# Patient Record
Sex: Female | Born: 1985 | Race: Black or African American | Hispanic: No | Marital: Single | Smoking: Current every day smoker
Health system: Southern US, Community
[De-identification: ages and names within clinical notes are randomized; demographics above are authoritative.]

## PROBLEM LIST (undated history)

## (undated) ENCOUNTER — Inpatient Hospital Stay (HOSPITAL_COMMUNITY): Payer: Self-pay

## (undated) DIAGNOSIS — J45909 Unspecified asthma, uncomplicated: Secondary | ICD-10-CM

## (undated) DIAGNOSIS — F32A Depression, unspecified: Secondary | ICD-10-CM

## (undated) HISTORY — PX: TUBAL LIGATION: SHX77

---

## 2013-05-27 DIAGNOSIS — IMO0001 Reserved for inherently not codable concepts without codable children: Secondary | ICD-10-CM | POA: Insufficient documentation

## 2013-05-27 DIAGNOSIS — A568 Sexually transmitted chlamydial infection of other sites: Secondary | ICD-10-CM | POA: Insufficient documentation

## 2013-05-27 DIAGNOSIS — A5403 Gonococcal cervicitis, unspecified: Secondary | ICD-10-CM | POA: Insufficient documentation

## 2013-05-27 DIAGNOSIS — R519 Headache, unspecified: Secondary | ICD-10-CM | POA: Insufficient documentation

## 2013-05-27 DIAGNOSIS — N841 Polyp of cervix uteri: Secondary | ICD-10-CM | POA: Insufficient documentation

## 2013-05-27 DIAGNOSIS — N898 Other specified noninflammatory disorders of vagina: Secondary | ICD-10-CM | POA: Insufficient documentation

## 2013-05-27 DIAGNOSIS — F32A Depression, unspecified: Secondary | ICD-10-CM | POA: Insufficient documentation

## 2013-05-27 DIAGNOSIS — Z23 Encounter for immunization: Secondary | ICD-10-CM | POA: Insufficient documentation

## 2013-05-27 DIAGNOSIS — N920 Excessive and frequent menstruation with regular cycle: Secondary | ICD-10-CM | POA: Insufficient documentation

## 2013-06-21 DIAGNOSIS — K8020 Calculus of gallbladder without cholecystitis without obstruction: Secondary | ICD-10-CM | POA: Insufficient documentation

## 2013-07-04 DIAGNOSIS — Z9189 Other specified personal risk factors, not elsewhere classified: Secondary | ICD-10-CM | POA: Insufficient documentation

## 2013-07-04 DIAGNOSIS — Z3040 Encounter for surveillance of contraceptives, unspecified: Secondary | ICD-10-CM | POA: Insufficient documentation

## 2013-07-04 DIAGNOSIS — N760 Acute vaginitis: Secondary | ICD-10-CM | POA: Insufficient documentation

## 2014-01-31 DIAGNOSIS — N870 Mild cervical dysplasia: Secondary | ICD-10-CM | POA: Insufficient documentation

## 2014-01-31 DIAGNOSIS — Z01419 Encounter for gynecological examination (general) (routine) without abnormal findings: Secondary | ICD-10-CM | POA: Insufficient documentation

## 2014-03-05 DIAGNOSIS — R87811 Vaginal high risk human papillomavirus (HPV) DNA test positive: Secondary | ICD-10-CM | POA: Insufficient documentation

## 2014-03-15 DIAGNOSIS — A630 Anogenital (venereal) warts: Secondary | ICD-10-CM | POA: Insufficient documentation

## 2014-03-20 DIAGNOSIS — R109 Unspecified abdominal pain: Secondary | ICD-10-CM | POA: Insufficient documentation

## 2014-05-12 ENCOUNTER — Encounter (HOSPITAL_BASED_OUTPATIENT_CLINIC_OR_DEPARTMENT_OTHER): Payer: Self-pay | Admitting: Emergency Medicine

## 2014-05-12 ENCOUNTER — Emergency Department (HOSPITAL_BASED_OUTPATIENT_CLINIC_OR_DEPARTMENT_OTHER)
Admission: EM | Admit: 2014-05-12 | Discharge: 2014-05-12 | Disposition: A | Discharge status: Home / Self Care | Payer: Medicare Other | Attending: Emergency Medicine | Admitting: Emergency Medicine

## 2014-05-12 DIAGNOSIS — R1031 Right lower quadrant pain: Secondary | ICD-10-CM | POA: Insufficient documentation

## 2014-05-12 DIAGNOSIS — N390 Urinary tract infection, site not specified: Secondary | ICD-10-CM | POA: Insufficient documentation

## 2014-05-12 DIAGNOSIS — R1032 Left lower quadrant pain: Secondary | ICD-10-CM | POA: Diagnosis present

## 2014-05-12 DIAGNOSIS — Z3202 Encounter for pregnancy test, result negative: Secondary | ICD-10-CM | POA: Insufficient documentation

## 2014-05-12 DIAGNOSIS — N72 Inflammatory disease of cervix uteri: Secondary | ICD-10-CM

## 2014-05-12 DIAGNOSIS — F17200 Nicotine dependence, unspecified, uncomplicated: Secondary | ICD-10-CM | POA: Insufficient documentation

## 2014-05-12 LAB — URINALYSIS, ROUTINE W REFLEX MICROSCOPIC
Glucose, UA: NEGATIVE mg/dL
Ketones, ur: 15 mg/dL — AB
NITRITE: NEGATIVE
PROTEIN: 30 mg/dL — AB
SPECIFIC GRAVITY, URINE: 1.033 — AB (ref 1.005–1.030)
UROBILINOGEN UA: 4 mg/dL — AB (ref 0.0–1.0)
pH: 6 (ref 5.0–8.0)

## 2014-05-12 LAB — WET PREP, GENITAL
Trich, Wet Prep: NONE SEEN
Yeast Wet Prep HPF POC: NONE SEEN

## 2014-05-12 LAB — URINE MICROSCOPIC-ADD ON

## 2014-05-12 LAB — PREGNANCY, URINE: PREG TEST UR: NEGATIVE

## 2014-05-12 MED ORDER — AZITHROMYCIN 250 MG PO TABS
1000.0000 mg | ORAL_TABLET | Freq: Once | ORAL | Status: AC
  Administered 2014-05-12: 1000 mg via ORAL
  Filled 2014-05-12: qty 4

## 2014-05-12 MED ORDER — CEFTRIAXONE SODIUM 250 MG IJ SOLR
250.0000 mg | Freq: Once | INTRAMUSCULAR | Status: AC
  Administered 2014-05-12: 250 mg via INTRAMUSCULAR
  Filled 2014-05-12: qty 250

## 2014-05-12 MED ORDER — SULFAMETHOXAZOLE-TRIMETHOPRIM 800-160 MG PO TABS: 1.0000 | ORAL_TABLET | Freq: Two times a day (BID) | ORAL | Status: DC

## 2014-05-12 NOTE — ED Notes (Signed)
Pelvic cart is at the bedside set up and ready for the doctor to use. 

## 2014-05-12 NOTE — ED Provider Notes (Signed)
CSN: [id number]     Arrival date & time 05/12/14  1207 History   First MD Initiated Contact with Patient 05/12/14 1236     Chief Complaint  Patient presents with  . Abdominal Pain     (Consider location/radiation/quality/duration/timing/severity/associated sxs/prior Treatment) Patient is a 28 y.o. female presenting with abdominal pain. The history is provided by the patient. No language interpreter was used.  Abdominal Pain Pain location:  LLQ, RLQ and suprapubic Pain quality: aching and cramping   Pain radiates to:  L flank and R flank Pain severity:  Moderate Associated symptoms: nausea   Associated symptoms: no chest pain, no constipation, no cough, no diarrhea, no dysuria, no fever, no shortness of breath, no vaginal bleeding, no vaginal discharge and no vomiting   Associated symptoms comment:  Lower abdominal pain for the past 2-3 days. No fever. She reports nausea without vomiting. No change in bowel habits. She denies vaginal discharge, dysuria or abnormal vaginal bleeding.    History reviewed. No pertinent past medical history. History reviewed. No pertinent past surgical history. No family history on file. History  Substance Use Topics  . Smoking status: Current Every Day Smoker  . Smokeless tobacco: Not on file  . Alcohol Use: Yes     Comment: a few times a week   OB History   Grav Para Term Preterm Abortions TAB SAB Ect Mult Living                 Review of Systems  Constitutional: Negative for fever.  Respiratory: Negative for cough and shortness of breath.   Cardiovascular: Negative for chest pain.  Gastrointestinal: Positive for nausea and abdominal pain. Negative for vomiting, diarrhea and constipation.  Genitourinary: Positive for flank pain. Negative for dysuria, frequency, vaginal bleeding and vaginal discharge.      Allergies  Review of patient's allergies indicates no known allergies.  Home Medications   Prior to Admission medications   Not on  File   BP 122/86  Pulse 110  Temp(Src) 99 F (37.2 C) (Oral)  Resp 20  Ht 5\' 2"  (1.575 m)  Wt 214 lb (97.07 kg)  BMI 39.13 kg/m2  SpO2 100%  LMP 05/02/2014 Physical Exam  Constitutional: She is oriented to person, place, and time. She appears well-developed and well-nourished.  HENT:  Head: Normocephalic.  Neck: Normal range of motion. Neck supple.  Cardiovascular: Normal rate and regular rhythm.   Pulmonary/Chest: Effort normal and breath sounds normal.  Abdominal: Soft. Bowel sounds are normal. There is no rebound and no guarding.  Lower abdominal tenderness to soft abdomen. BS active. No palpable mass.   Genitourinary: Vagina normal.  Cervical discharge present that is thin, green in appearance. There is bimanual tenderness throughout pelvis without mass.   Musculoskeletal: Normal range of motion.  Neurological: She is alert and oriented to person, place, and time.  Skin: Skin is warm and dry. No rash noted.  Psychiatric: She has a normal mood and affect.    ED Course  Procedures (including critical care time) Labs Review Labs Reviewed  WET PREP, GENITAL - Abnormal; Notable for the following:    Clue Cells Wet Prep HPF POC MODERATE (*)    WBC, Wet Prep HPF POC TOO NUMEROUS TO COUNT (*)    All other components within normal limits  URINALYSIS, ROUTINE W REFLEX MICROSCOPIC - Abnormal; Notable for the following:    Color, Urine AMBER (*)    APPearance CLOUDY (*)    Specific Gravity, Urine 1.033 (*)  Hgb urine dipstick MODERATE (*)    Bilirubin Urine SMALL (*)    Ketones, ur 15 (*)    Protein, ur 30 (*)    Urobilinogen, UA 4.0 (*)    Leukocytes, UA MODERATE (*)    All other components within normal limits  URINE MICROSCOPIC-ADD ON - Abnormal; Notable for the following:    Squamous Epithelial / LPF FEW (*)    Bacteria, UA MANY (*)    All other components within normal limits  GC/CHLAMYDIA PROBE AMP  PREGNANCY, URINE    Imaging Review No results found.   EKG  Interpretation None      MDM   Final diagnoses:  None    1. UTI 2. Cervicitis  No fever reported more than low grade. Mild pelvic tenderness. Doubt fulminant PID or torsion. She has evidence of UTI on urinalysis with bilateral flank pain. There is also cervical discharge with pelvic tenderness that reproduces her abdominal pain of complaint. Cultures pending. Azithromycin/Rocephin given in ED. Will give Septra for 5 days for UTI. Recommend Doxycycline x 7 days if cultures positive.     Dewaine Oats, PA-C 05/12/14 1408

## 2014-05-12 NOTE — Discharge Instructions (Signed)
Cervicitis Cervicitis is a soreness and swelling (inflammation) of the cervix. Your cervix is located at the bottom of your uterus. It opens up to the vagina. CAUSES   Sexually transmitted infections (STIs).   Allergic reaction.   Medicines or birth control devices that are put in the vagina.   Injury to the cervix.   Bacterial infections.  RISK FACTORS You are at greater risk if you:  Have unprotected sexual intercourse.  Have sexual intercourse with many partners.  Began sexual intercourse at an early age.  Have a history of STIs. SYMPTOMS  There may be no symptoms. If symptoms occur, they may include:   Grey, white, yellow, or bad-smelling vaginal discharge.   Pain or itching of the area outside the vagina.   Painful sexual intercourse.   Lower abdominal or lower back pain, especially during intercourse.   Frequent urination.   Abnormal vaginal bleeding between periods, after sexual intercourse, or after menopause.   Pressure or a heavy feeling in the pelvis.  DIAGNOSIS  Diagnosis is made after a pelvic exam. Other tests may include:   Examination of any discharge under a microscope (wet prep).   A Pap test.  TREATMENT  Treatment will depend on the cause of cervicitis. If it is caused by an STI, both you and your partner will need to be treated. Antibiotic medicines will be given.  HOME CARE INSTRUCTIONS   Do not have sexual intercourse until your health care provider says it is okay.   Do not have sexual intercourse until your partner has been treated, if your cervicitis is caused by an STI.   Take your antibiotics as directed. Finish them even if you start to feel better.  SEEK MEDICAL CARE IF:  Your symptoms come back.   You have a fever.  MAKE SURE YOU:   Understand these instructions.  Will watch your condition.  Will get help right away if you are not doing well or get worse. Document Released: 12/07/2005 Document Revised:  08/09/2013 Document Reviewed: 05/31/2013 [facility] Patient Information 2014 [place]. Urinary Tract Infection Urinary tract infections (UTIs) can develop anywhere along your urinary tract. Your urinary tract is your body's drainage system for removing wastes and extra water. Your urinary tract includes two kidneys, two ureters, a bladder, and a urethra. Your kidneys are a pair of bean-shaped organs. Each kidney is about the size of your fist. They are located below your ribs, one on each side of your spine. CAUSES Infections are caused by microbes, which are microscopic organisms, including fungi, viruses, and bacteria. These organisms are so small that they can only be seen through a microscope. Bacteria are the microbes that most commonly cause UTIs. SYMPTOMS  Symptoms of UTIs may vary by age and gender of the patient and by the location of the infection. Symptoms in young women typically include a frequent and intense urge to urinate and a painful, burning feeling in the bladder or urethra during urination. Older women and men are more likely to be tired, shaky, and weak and have muscle aches and abdominal pain. A fever may mean the infection is in your kidneys. Other symptoms of a kidney infection include pain in your back or sides below the ribs, nausea, and vomiting. DIAGNOSIS To diagnose a UTI, your caregiver will ask you about your symptoms. Your caregiver also will ask to provide a urine sample. The urine sample will be tested for bacteria and white blood cells. White blood cells are made by your  made by your body to help fight infection. °TREATMENT  °Typically, UTIs can be treated with medication. Because most UTIs are caused by a bacterial infection, they usually can be treated with the use of antibiotics. The choice of antibiotic and length of treatment depend on your symptoms and the type of bacteria causing your infection. °HOME CARE INSTRUCTIONS °· If you were prescribed antibiotics, take them  exactly as your caregiver instructs you. Finish the medication even if you feel better after you have only taken some of the medication. °· Drink enough water and fluids to keep your urine clear or pale yellow. °· Avoid caffeine, tea, and carbonated beverages. They tend to irritate your bladder. °· Empty your bladder often. Avoid holding urine for long periods of time. °· Empty your bladder before and after sexual intercourse. °· After a bowel movement, women should cleanse from front to back. Use each tissue only once. °SEEK MEDICAL CARE IF:  °· You have back pain. °· You develop a fever. °· Your symptoms do not begin to resolve within 3 days. °SEEK IMMEDIATE MEDICAL CARE IF:  °· You have severe back pain or lower abdominal pain. °· You develop chills. °· You have nausea or vomiting. °· You have continued burning or discomfort with urination. °MAKE SURE YOU:  °· Understand these instructions. °· Will watch your condition. °· Will get help right away if you are not doing well or get worse. °Document Released: 09/16/2005 Document Revised: 06/07/2012 Document Reviewed: 01/15/2012 °[employer]® Patient Information ©2014 [employer]. ° °

## 2014-05-12 NOTE — ED Notes (Addendum)
"  uncomfortable, don't feel right, vision blurry, in discomfort". Headaches, "can't go to the bathroom right". Took castoroil, no appetite.

## 2014-05-13 NOTE — ED Provider Notes (Signed)
Medical screening examination/treatment/procedure(s) were performed by non-physician practitioner and as supervising physician I was immediately available for consultation/collaboration.   EKG Interpretation None        Houston Siren III, MD 05/13/14 [phone]

## 2014-05-14 LAB — GC/CHLAMYDIA PROBE AMP
CT Probe RNA: POSITIVE — AB
GC Probe RNA: NEGATIVE

## 2014-05-15 ENCOUNTER — Telehealth (HOSPITAL_BASED_OUTPATIENT_CLINIC_OR_DEPARTMENT_OTHER): Payer: Self-pay | Admitting: Emergency Medicine

## 2014-05-16 ENCOUNTER — Telehealth (HOSPITAL_BASED_OUTPATIENT_CLINIC_OR_DEPARTMENT_OTHER): Payer: Self-pay

## 2014-05-16 NOTE — Telephone Encounter (Signed)
Pt returned call.    ID verified x 2.  Pt informed Chlamydia (+), tx rcvd appropriate, inform partner(s) for testing and tx and abstain from sex x 2 wks from tx date.

## 2015-04-09 ENCOUNTER — Other Ambulatory Visit: Payer: Self-pay

## 2015-04-09 ENCOUNTER — Encounter (HOSPITAL_BASED_OUTPATIENT_CLINIC_OR_DEPARTMENT_OTHER): Payer: Self-pay | Admitting: *Deleted

## 2015-04-09 ENCOUNTER — Emergency Department (HOSPITAL_BASED_OUTPATIENT_CLINIC_OR_DEPARTMENT_OTHER)
Admission: EM | Admit: 2015-04-09 | Discharge: 2015-04-09 | Disposition: A | Discharge status: Home / Self Care | Payer: Medicare Other | Attending: Emergency Medicine | Admitting: Emergency Medicine

## 2015-04-09 ENCOUNTER — Emergency Department (HOSPITAL_BASED_OUTPATIENT_CLINIC_OR_DEPARTMENT_OTHER): Payer: Medicare Other

## 2015-04-09 DIAGNOSIS — Z3202 Encounter for pregnancy test, result negative: Secondary | ICD-10-CM | POA: Diagnosis not present

## 2015-04-09 DIAGNOSIS — Z720 Tobacco use: Secondary | ICD-10-CM | POA: Insufficient documentation

## 2015-04-09 DIAGNOSIS — R079 Chest pain, unspecified: Secondary | ICD-10-CM | POA: Diagnosis present

## 2015-04-09 DIAGNOSIS — R51 Headache: Secondary | ICD-10-CM | POA: Insufficient documentation

## 2015-04-09 DIAGNOSIS — R0602 Shortness of breath: Secondary | ICD-10-CM | POA: Diagnosis not present

## 2015-04-09 DIAGNOSIS — R0789 Other chest pain: Secondary | ICD-10-CM | POA: Diagnosis not present

## 2015-04-09 DIAGNOSIS — J45901 Unspecified asthma with (acute) exacerbation: Secondary | ICD-10-CM | POA: Diagnosis not present

## 2015-04-09 HISTORY — DX: Unspecified asthma, uncomplicated: J45.909

## 2015-04-09 LAB — COMPREHENSIVE METABOLIC PANEL
ALT: 13 U/L (ref 0–35)
ANION GAP: 6 (ref 5–15)
AST: 15 U/L (ref 0–37)
Albumin: 3.6 g/dL (ref 3.5–5.2)
Alkaline Phosphatase: 67 U/L (ref 39–117)
BUN: 7 mg/dL (ref 6–23)
CO2: 25 mmol/L (ref 19–32)
Calcium: 8.5 mg/dL (ref 8.4–10.5)
Chloride: 108 mmol/L (ref 96–112)
Creatinine, Ser: 0.62 mg/dL (ref 0.50–1.10)
GFR calc Af Amer: >90 mL/min (ref >90)
GFR calc non Af Amer: >90 mL/min (ref >90)
Glucose, Bld: 94 mg/dL (ref 70–99)
POTASSIUM: 3.4 mmol/L — AB (ref 3.5–5.1)
SODIUM: 139 mmol/L (ref 135–145)
TOTAL PROTEIN: 7 g/dL (ref 6.0–8.3)
Total Bilirubin: 0.6 mg/dL (ref 0.3–1.2)

## 2015-04-09 LAB — CBC WITH DIFFERENTIAL/PLATELET
Basophils Absolute: 0 10*3/uL (ref 0.0–0.1)
Basophils Relative: 0 % (ref 0–1)
EOS PCT: 2 % (ref 0–5)
Eosinophils Absolute: 0.1 10*3/uL (ref 0.0–0.7)
HEMATOCRIT: 36.4 % (ref 36.0–46.0)
Hemoglobin: 12 g/dL (ref 12.0–15.0)
LYMPHS ABS: 2.5 10*3/uL (ref 0.7–4.0)
Lymphocytes Relative: 39 % (ref 12–46)
MCH: 25.8 pg — AB (ref 26.0–34.0)
MCHC: 33 g/dL (ref 30.0–36.0)
MCV: 78.1 fL (ref 78.0–100.0)
Monocytes Absolute: 0.4 10*3/uL (ref 0.1–1.0)
Monocytes Relative: 6 % (ref 3–12)
Neutro Abs: 3.4 10*3/uL (ref 1.7–7.7)
Neutrophils Relative %: 53 % (ref 43–77)
PLATELETS: 206 10*3/uL (ref 150–400)
RBC: 4.66 MIL/uL (ref 3.87–5.11)
RDW: 16.2 % — AB (ref 11.5–15.5)
WBC: 6.4 10*3/uL (ref 4.0–10.5)

## 2015-04-09 LAB — D-DIMER, QUANTITATIVE (NOT AT ARMC)

## 2015-04-09 LAB — PREGNANCY, URINE: PREG TEST UR: NEGATIVE

## 2015-04-09 LAB — TROPONIN I

## 2015-04-09 MED ORDER — SODIUM CHLORIDE 0.9 % IV BOLUS (SEPSIS)
1000.0000 mL | Freq: Once | INTRAVENOUS | Status: AC
  Administered 2015-04-09: 1000 mL via INTRAVENOUS

## 2015-04-09 MED ORDER — ALBUTEROL SULFATE HFA 108 (90 BASE) MCG/ACT IN AERS
2.0000 | INHALATION_SPRAY | Freq: Four times a day (QID) | RESPIRATORY_TRACT | Status: DC | PRN
  Administered 2015-04-09: 2 via RESPIRATORY_TRACT
  Filled 2015-04-09: qty 6.7

## 2015-04-09 MED ORDER — POTASSIUM CHLORIDE CRYS ER 20 MEQ PO TBCR
40.0000 meq | EXTENDED_RELEASE_TABLET | Freq: Once | ORAL | Status: AC
  Administered 2015-04-09: 40 meq via ORAL
  Filled 2015-04-09: qty 2

## 2015-04-09 NOTE — ED Notes (Addendum)
C/o mid upper chest pain and h/a Onset this am. No cough, sob, n/v or fever. Pt states she has had these sx before and been dx with stress.

## 2015-04-09 NOTE — ED Notes (Signed)
Pt placed on cardiac monitor 

## 2015-04-09 NOTE — Discharge Instructions (Signed)
Please call your doctor for a followup appointment within 24-48 hours. When you talk to your doctor please let them know that you were seen in the emergency department and have them acquire all of your records so that they can discuss the findings with you and formulate a treatment plan to fully care for your new and ongoing problems. Please follow-up with her primary care provider Please rest and stay hydrated Please avoid any physical or strenuous activity Please use albuterol inhaler as needed for shortness of breath or wheezing-please use only as needed every 6 hours Please continue to monitor symptoms closely and if symptoms are to worsen or change (fever greater than 101, chills, sweating, nausea, vomiting, chest pain, shortness of breathe, difficulty breathing, weakness, numbness, tingling, worsening or changes to pain pattern, dizziness, blurred vision, sudden loss of vision, neck pain, neck stiffness, worsening or changes to chest pain, cough, coughing up of blood, inability to keep food or fluids down, leg swelling) please report back to the Emergency Department immediately.    Chest Pain (Nonspecific) It is often hard to give a specific diagnosis for the cause of chest pain. There is always a chance that your pain could be related to something serious, such as a heart attack or a blood clot in the lungs. You need to follow up with your health care provider for further evaluation. CAUSES   Heartburn.  Pneumonia or bronchitis.  Anxiety or stress.  Inflammation around your heart (pericarditis) or lung (pleuritis or pleurisy).  A blood clot in the lung.  A collapsed lung (pneumothorax). It can develop suddenly on its own (spontaneous pneumothorax) or from trauma to the chest.  Shingles infection (herpes zoster virus). The chest wall is composed of bones, muscles, and cartilage. Any of these can be the source of the pain.  The bones can be bruised by injury.  The muscles or  cartilage can be strained by coughing or overwork.  The cartilage can be affected by inflammation and become sore (costochondritis). DIAGNOSIS  Lab tests or other studies may be needed to find the cause of your pain. Your health care provider may have you take a test called an ambulatory electrocardiogram (ECG). An ECG records your heartbeat patterns over a 24-hour period. You may also have other tests, such as:  Transthoracic echocardiogram (TTE). During echocardiography, sound waves are used to evaluate how blood flows through your heart.  Transesophageal echocardiogram (TEE).  Cardiac monitoring. This allows your health care provider to monitor your heart rate and rhythm in real time.  Holter monitor. This is a portable device that records your heartbeat and can help diagnose heart arrhythmias. It allows your health care provider to track your heart activity for several days, if needed.  Stress tests by exercise or by giving medicine that makes the heart beat faster. TREATMENT   Treatment depends on what may be causing your chest pain. Treatment may include:  Acid blockers for heartburn.  Anti-inflammatory medicine.  Pain medicine for inflammatory conditions.  Antibiotics if an infection is present.  You may be advised to change lifestyle habits. This includes stopping smoking and avoiding alcohol, caffeine, and chocolate.  You may be advised to keep your head raised (elevated) when sleeping. This reduces the chance of acid going backward from your stomach into your esophagus. Most of the time, nonspecific chest pain will improve within 2-3 days with rest and mild pain medicine.  HOME CARE INSTRUCTIONS   If antibiotics were prescribed, take them as directed. [employer]  them even if you start to feel better.  For the next few days, avoid physical activities that bring on chest pain. Continue physical activities as directed.  Do not use any tobacco products, including cigarettes,  chewing tobacco, or electronic cigarettes.  Avoid drinking alcohol.  Only take medicine as directed by your health care provider.  Follow your health care provider's suggestions for further testing if your chest pain does not go away.  Keep any follow-up appointments you made. If you do not go to an appointment, you could develop lasting (chronic) problems with pain. If there is any problem keeping an appointment, call to reschedule. SEEK MEDICAL CARE IF:   Your chest pain does not go away, even after treatment.  You have a rash with blisters on your chest.  You have a fever. SEEK IMMEDIATE MEDICAL CARE IF:   You have increased chest pain or pain that spreads to your arm, neck, jaw, back, or abdomen.  You have shortness of breath.  You have an increasing cough, or you cough up blood.  You have severe back or abdominal pain.  You feel nauseous or vomit.  You have severe weakness.  You faint.  You have chills. This is an emergency. Do not wait to see if the pain will go away. Get medical help at once. Call your local emergency services (911 in [place] Do not drive yourself to the hospital. MAKE SURE YOU:   Understand these instructions.  Will watch your condition.  Will get help right away if you are not doing well or get worse. Document Released: 09/16/2005 Document Revised: 12/12/2013 Document Reviewed: 07/12/2008 [facility] Patient Information 2015 [place], [place]. This information is not intended to replace advice given to you by your health care provider. Make sure you discuss any questions you have with your health care provider.  Chest Wall Pain Chest wall pain is pain in or around the bones and muscles of your chest. It may take up to 6 weeks to get better. It may take longer if you must stay physically active in your work and activities.  CAUSES  Chest wall pain may happen on its own. However, it may be caused by:  A viral illness like the  flu.  Injury.  Coughing.  Exercise.  Arthritis.  Fibromyalgia.  Shingles. HOME CARE INSTRUCTIONS   Avoid overtiring physical activity. Try not to strain or perform activities that cause pain. This includes any activities using your chest or your abdominal and side muscles, especially if heavy weights are used.  Put ice on the sore area.  Put ice in a plastic bag.  Place a towel between your skin and the bag.  Leave the ice on for 15-20 minutes per hour while awake for the first 2 days.  Only take over-the-counter or prescription medicines for pain, discomfort, or fever as directed by your caregiver. SEEK IMMEDIATE MEDICAL CARE IF:   Your pain increases, or you are very uncomfortable.  You have a fever.  Your chest pain becomes worse.  You have new, unexplained symptoms.  You have nausea or vomiting.  You feel sweaty or lightheaded.  You have a cough with phlegm (sputum), or you cough up blood. MAKE SURE YOU:   Understand these instructions.  Will watch your condition.  Will get help right away if you are not doing well or get worse. Document Released: 12/07/2005 Document Revised: 02/29/2012 Document Reviewed: 08/03/2011 [facility] Patient Information 2015 [place], [place]. This information is not intended to replace advice given  to you by your health care provider. Make sure you discuss any questions you have with your health care provider.   Emergency Department Resource Guide 1) Find a Doctor and Pay Out of Pocket Although you won't have to find out who is covered by your insurance plan, it is a good idea to ask around and get recommendations. You will then need to call the office and see if the doctor you have chosen will accept you as a new patient and what types of options they offer for patients who are self-pay. Some doctors offer discounts or will set up payment plans for their patients who do not have insurance, but you will need to ask so you aren't  surprised when you get to your appointment.  2) Contact Your Local Health Department Not all health departments have doctors that can see patients for sick visits, but many do, so it is worth a call to see if yours does. If you don't know where your local health department is, you can check in your phone book. The CDC also has a tool to help you locate your state's health department, and many state websites also have listings of all of their local health departments.  3) Find a [facility] If your illness is not likely to be very severe or complicated, you may want to try a walk in clinic. These are popping up all over the country in pharmacies, drugstores, and shopping centers. They're usually staffed by nurse practitioners or physician assistants that have been trained to treat common illnesses and complaints. They're usually fairly quick and inexpensive. However, if you have serious medical issues or chronic medical problems, these are probably not your best option.  No Primary Care Doctor: - Call Health Connect at  [phone] - they can help you locate a primary care doctor that  accepts your insurance, provides certain services, etc. - Physician Referral Service- [phone]  Chronic Pain Problems: Organization         Address  Phone   Notes  [facility]  [phone] Patients need to be referred by their primary care doctor.   Medication Assistance: Organization         Address  Phone   Notes  [facility] Medication [facility] [address] [place], [place] [address] [phone] --Must be a resident of [facility] -- Must have NO insurance coverage whatsoever (no Medicaid/ Medicare, etc.) -- The pt. MUST have a primary care doctor that directs their care regularly and follows them in the community   MedAssist  [phone]   [employer]  [phone]    Agencies that provide inexpensive medical care: Organization          Address  Phone   Notes  [place]  [phone]   [facility]    [phone]   [facility] [place], [place] [address] [phone]   [place] [address] [place] [phone]   Planned Parenthood    [phone]   [facility]    [phone]   [place] and [place] [address], [place] Phone:  [phone], Fax:  [phone] Hours of Operation:  9 am - 6 pm, M-F.  Also accepts Medicaid/Medicare and self-pay.  [facility] for [place] [address], [place] Phone: [phone], Fax: [phone]. Hours of Operation:  8:30 am - 5:30 pm, M-F.  Also accepts Medicaid and self-pay.  [facility] [place] [address] [place] Phone: [phone]   [place], [place], [place] [phone], Ext. [phone] Mondays & Thursdays: 7-9 AM.  First 15 patients are seen on a first come, first serve basis.    [place] Providers:  Organization         Address  Phone   Notes  [facility] [address], [place] [phone] Also accepts self-pay patients.  [facility] [address], [place]  [phone]   [place], [address], [place] [phone]   [facility] [address] [place] [phone]   Lucianne Lei [address], [place]   [phone] Only accepts [place] Access [place] patients after they have their name applied to their card.   Self-Pay (no insurance) in [facility]:  Organization         Address  Phone   Notes  Sickle Cell Patients, [facility] Internal Medicine [place] [phone]   [facility] [place] [phone]    [facility] [place]  [place], [place], [place] [phone]   [facility]/Dr. Osei-Bonsu  [address], [place] or [address], [place] [phone] Phone number for both [place] and [place] locations is the same.  [facility] [address], [place] [phone]   [facility] [address] [place] or [address] [phone]   [facility] [address] [place] [phone], phone; [phone], fax Sees patients 1st and 3rd Saturday of every month.  Must not qualify for public or private insurance (i.e. Medicaid, Medicare, [place] Health Choice, Veterans' Benefits)  Household income should be no more than 200% of the poverty level The clinic cannot treat you if you are pregnant or think you are pregnant  Sexually transmitted diseases are not treated at the clinic.    Dental Care: Organization         Address  Phone  Notes  [facility] Department of [facility] [place] [phone] Accepts children up to age 70 who are enrolled in [place] or [place]; pregnant women with a Medicaid card; and children who have applied for Medicaid or [place] Health Choice, but were declined, whose parents can pay a reduced fee at time of service.  [facility] Department of [facility]  [address], [place] [phone] Accepts children up to age 61 who are enrolled in [place] or [place]; pregnant women with a Medicaid card; and children who have applied for Medicaid or [place] Health Choice, but were declined, whose parents can pay a reduced fee at time of service.  [facility]  [place] [phone] Patients are seen by appointment only. Walk-ins are not accepted. [place] will see patients 52  years of age and older. Monday - Tuesday (8am-5pm) Most Wednesdays (8:30-5pm) $30 per visit, cash only  [place] Adult [employer] PROGRAM  [address], [employer] [phone])  [phone] Patients are seen by appointment only. Walk-ins are not accepted. [place] will see patients 63 years of age and older. One Wednesday Evening (Monthly: Volunteer Based).  $30 per visit, cash only  [place]  [phone] for adults; Children under age 52, call Graduate Pediatric Dentistry at [phone]. Children aged 28-14, please call [phone] to request a pediatric application.  Dental services are provided in all areas of dental care including fillings, crowns and bridges, complete and partial dentures, implants, gum treatment, root canals, and extractions. Preventive care is also provided. Treatment is provided to both adults and children. Patients are selected via a lottery and there is often a waiting list.   [facility] [address], [place]  [phone] [email or web address]   [facility] [address] [place], [place] [phone], Ext. [phone] Second and Fourth Thursday of each month, opens at 6:30 AM; Clinic ends at 9 AM.  Patients are seen on a first-come first-served basis, and a limited number are seen during each clinic.   [facility]  [address] Hillard Danker [place], [place] [phone]   Eligibility Requirements You must have lived in [place], [place], or [place] counties for at least the last three months.   You cannot be eligible for state or federal sponsored [employer], including [employer], [place], or [employer].   You generally cannot be eligible for healthcare insurance through your employer.    How to apply: Eligibility screenings are held every Tuesday and Wednesday afternoon from 1:00 pm until 4:00 pm. You do not need an appointment for the interview!  [facility]  [address] [place], [place]   Miguel Barrera  [place] Department  [place]  [phone]    Behavioral Health Resources in the Community: Intensive Outpatient Programs Organization         Address  Phone  Notes  [place]. [address], [place], [place] [phone]   [facility] [address], [place], [place]   ADS: Alcohol & Drug Svcs [address] [place], [place]   [place] [address],  [place], [place] or [phone]   Substance Abuse Resources Organization         Address  Phone  Notes  Alcohol [facility] Drug Services  [phone]   Center  [phone]   [facility] [place]   Chinita Pester  [phone]   Residential & Outpatient Substance Abuse Program  [phone]   Psychological Services Organization         Address  Phone  Notes  [facility]  [place]  [phone]   [place] [address] [place] or [phone]    Mobile Crisis Teams Organization         Address  Phone  Notes  [facility], Mobile Crisis Care Unit  [phone]   [facility]otherapeutic [facility]  [address] [place], [place]   [place] [address] [place] [phone]    Self-Help/Support Groups Organization         Address  Phone             Notes  [place]. of [facility] - variety of support groups  [place]  Call for more information  Narcotics Anonymous (NA), [facility] [address], [employer]   2 meetings at this location   Residential Facilities manager         Address  Phone  Notes  [facility]idential Treatment [place],    [place]   [phone]   [facility]  [address], [place] [address]34287, [place], [place]   [place], [place] [phone] Admissions: 8am-3pm M-F  Incentives Substance [place] [address]    [place], [place] [phone]   [facility] [address] [place], [place], [place]   [facility] [address],  [place], [place]   [facility] Programs - Intensive Outpatient [address] Kristeen [address], [place], [place]   [facility] ([place].,  [place], [place] [phone] or [phone]   [facility] Services (RTS) [address], [place], [place] Accepts Medicaid  [facility] [place] [address],  [place] [phone] Substance Abuse/Addiction Treatment   [facility] Organization         Address  Phone  Notes  [facility]  [phone]   Domenic Schwab, PhD [address] Arlis [place], [place]   [phone] or [phone]   Dor[place], [place] [phone]   [facility] [address], [place], [place] [phone] Insurance/Medicaid/sponsorship through [facility] and Families [address]                                    [place], [place] [phone] [place] [address], [place] [phone]    Dr. Adele Schilder  [phone]   [facility] Dept. 1) [address],  2) [place] 3)  [place] [address], [place] [phone]  [phone]   [place] [phone] or [phone] (After Hours)

## 2015-04-09 NOTE — ED Provider Notes (Signed)
CSN: [id number]     Arrival date & time 04/09/15  1028 History   First MD Initiated Contact with Patient 04/09/15 1126     Chief Complaint  Patient presents with  . Chest Pain     (Consider location/radiation/quality/duration/timing/severity/associated sxs/prior Treatment) The history is provided by the patient. No language interpreter was used.  Michelle Giles is a 29 year old female with past medical history of asthma-has not taken medication in over 5 years-presenting to the ED with chest pain that started this morning. Patient reported that she was asleep, but continued to feel the chest discomfort. Patient reported that the pain is localized to the center of her chest and right side described as a tightening sensation. Reported that the pain is worse with deep inhalation and exhalation. Patient reported that she felt some shortness of breath and some difficulty breathing. Reported the pain stayed within the chest without radiation. Stated that she took nothing at home. Patient reports that she has experienced this pain before, the only difference is that it has stayed much longer, reports that it normally goes away with time. Patient stated that she is under a lot of stress-she is raising 5 children all by herself and states that she is currently raising her sick mother. Patient reports that she smokes approximately half a pack of cigarettes per day. Stated that she does a lot of lifting of her children daily. Reported that she is currently on Depo-Provera, reported that her last dose was either in February or March 2016, reported that her next dose is -2016. Denied wheezing, cough, nasal congestion, ear/eye complaints, neck pain, neck stiffness, sore throat, difficulty swallowing, nausea, vomiting, abdominal pain, numbness, tingling, blurred vision, sudden loss of vision, fainting, hemoptysis, leg swelling, travel. PCP Dr. Joya Gaskins   Past Medical History  Diagnosis Date  . Asthma    History  reviewed. No pertinent past surgical history. No family history on file. History  Substance Use Topics  . Smoking status: Current Every Day Smoker -- 0.50 packs/day    Types: Cigarettes  . Smokeless tobacco: Not on file  . Alcohol Use: Yes     Comment: a few times a week   OB History    No data available     Review of Systems  Constitutional: Negative for fever and chills.  Eyes: Negative for visual disturbance.  Respiratory: Positive for chest tightness and shortness of breath. Negative for cough.   Cardiovascular: Positive for chest pain. Negative for leg swelling.  Gastrointestinal: Negative for nausea, vomiting and abdominal pain.  Musculoskeletal: Negative for neck pain and neck stiffness.  Neurological: Positive for headaches. Negative for dizziness, weakness and numbness.      Allergies  Review of patient's allergies indicates no known allergies.  Home Medications   Prior to Admission medications   Not on File   BP 101/63 mmHg  Pulse 72  Temp(Src) 97.9 F (36.6 C) (Oral)  Resp 20  Ht 5\' 2"  (1.575 m)  Wt 210 lb (95.255 kg)  BMI 38.40 kg/m2  SpO2 100%  LMP 03/08/2015 Physical Exam  Constitutional: She is oriented to person, place, and time. She appears well-developed and well-nourished. No distress.  Patient resting comfortably in bed, texting on cell phone  HENT:  Head: Normocephalic and atraumatic.  Mouth/Throat: Oropharynx is clear and moist. No oropharyngeal exudate.  Eyes: Conjunctivae and EOM are normal. Pupils are equal, round, and reactive to light. Right eye exhibits no discharge. Left eye exhibits no discharge.  Neck: Normal range of  motion. Neck supple. No tracheal deviation present.  Cardiovascular: Normal rate, regular rhythm and normal heart sounds.  Exam reveals no friction rub.   No murmur heard. Pulses:      Radial pulses are 2+ on the right side, and 2+ on the left side.       Dorsalis pedis pulses are 2+ on the right side, and 2+ on the  left side.  Cap refill < 3 seconds Negative swelling or pitting edema noted to the lower extremities bilaterally  Pulmonary/Chest: Effort normal and breath sounds normal. No respiratory distress. She has no wheezes. She has no rales. She exhibits tenderness.    Patient is able to speak in full sentences without difficulty  Negative use of accessory muscles Negative stridor   Tenderness upon palpation tot he center of the chest - patient reported that this is a soreness upon palpation. Pain reproducible upon palpation to the chest wall.   Musculoskeletal: Normal range of motion.  Full ROM to upper and lower extremities without difficulty noted, negative ataxia noted.  Lymphadenopathy:    She has no cervical adenopathy.  Neurological: She is alert and oriented to person, place, and time. No cranial nerve deficit. She exhibits normal muscle tone. Coordination normal. GCS eye subscore is 4. GCS verbal subscore is 5. GCS motor subscore is 6.  Cranial nerves III-XII grossly intact Strength 5+/5+ to upper and lower extremities bilaterally with resistance applied, equal distribution noted Equal grip strength bilaterally Negative facial droop Negative slurred speech  Negative aphasia Patient follows commands well  Patient responds to questions appropriately  Negative arm drift Fine motor skills intact Patient is able to bring finger to nose bilaterally   Skin: Skin is warm and dry. No rash noted. She is not diaphoretic. No erythema.  Psychiatric: She has a normal mood and affect. Her behavior is normal. Thought content normal.  Nursing note and vitals reviewed.   ED Course  Procedures (including critical care time)  Results for orders placed or performed during the hospital encounter of 04/09/15  CBC with Differential/Platelet  Result Value Ref Range   WBC 6.4 4.0 - 10.5 K/uL   RBC 4.66 3.87 - 5.11 MIL/uL   Hemoglobin 12.0 12.0 - 15.0 g/dL   HCT 36.4 36.0 - 46.0 %   MCV 78.1 78.0 -  100.0 fL   MCH 25.8 (L) 26.0 - 34.0 pg   MCHC 33.0 30.0 - 36.0 g/dL   RDW 16.2 (H) 11.5 - 15.5 %   Platelets 206 150 - 400 K/uL   Neutrophils Relative % 53 43 - 77 %   Neutro Abs 3.4 1.7 - 7.7 K/uL   Lymphocytes Relative 39 12 - 46 %   Lymphs Abs 2.5 0.7 - 4.0 K/uL   Monocytes Relative 6 3 - 12 %   Monocytes Absolute 0.4 0.1 - 1.0 K/uL   Eosinophils Relative 2 0 - 5 %   Eosinophils Absolute 0.1 0.0 - 0.7 K/uL   Basophils Relative 0 0 - 1 %   Basophils Absolute 0.0 0.0 - 0.1 K/uL  Comprehensive metabolic panel  Result Value Ref Range   Sodium 139 135 - 145 mmol/L   Potassium 3.4 (L) 3.5 - 5.1 mmol/L   Chloride 108 96 - 112 mmol/L   CO2 25 19 - 32 mmol/L   Glucose, Bld 94 70 - 99 mg/dL   BUN 7 6 - 23 mg/dL   Creatinine, Ser 0.62 0.50 - 1.10 mg/dL   Calcium 8.5 8.4 - 10.5  mg/dL   Total Protein 7.0 6.0 - 8.3 g/dL   Albumin 3.6 3.5 - 5.2 g/dL   AST 15 0 - 37 U/L   ALT 13 0 - 35 U/L   Alkaline Phosphatase 67 39 - 117 U/L   Total Bilirubin 0.6 0.3 - 1.2 mg/dL   GFR calc non Af Amer >90 >90 mL/min   GFR calc Af Amer >90 >90 mL/min   Anion gap 6 5 - 15  Troponin I  Result Value Ref Range   Troponin I <0.03 <0.031 ng/mL  Pregnancy, urine  Result Value Ref Range   Preg Test, Ur NEGATIVE NEGATIVE  D-dimer, quantitative  Result Value Ref Range   D-Dimer, Quant <0.27 0.00 - 0.48 ug/mL-FEU    Labs Review Labs Reviewed  CBC WITH DIFFERENTIAL/PLATELET - Abnormal; Notable for the following:    MCH 25.8 (*)    RDW 16.2 (*)    All other components within normal limits  COMPREHENSIVE METABOLIC PANEL - Abnormal; Notable for the following:    Potassium 3.4 (*)    All other components within normal limits  TROPONIN I  PREGNANCY, URINE  D-DIMER, QUANTITATIVE    Imaging Review Dg Chest 2 View  04/09/2015   CLINICAL DATA:  Chest pain. Onset of symptoms this morning. Short of breath. Headache.  EXAM: CHEST  2 VIEW  COMPARISON:  None.  FINDINGS: Cardiopericardial silhouette within  normal limits. Mediastinal contours normal. Trachea midline. No airspace disease or effusion. Monitoring leads project over the chest.  IMPRESSION: No active cardiopulmonary disease.   Electronically Signed   By: Dereck Ligas M.D.   On: 04/09/2015 12:39     EKG Interpretation   Date/Time:  Tuesday April 09 2015 10:35:37 EDT Ventricular Rate:  78 PR Interval:  184 QRS Duration: 66 QT Interval:  368 QTC Calculation: 419 R Axis:   5 Text Interpretation:  Sinus rhythm with marked sinus arrhythmia Otherwise  normal ECG Confirmed by HARRISON  MD, FORREST ([id number]) on 04/09/2015 10:44:05  AM      MDM   Final diagnoses:  Chest pain, unspecified chest pain type  Chest wall pain    Medications  albuterol (PROVENTIL HFA;VENTOLIN HFA) 108 (90 BASE) MCG/ACT inhaler 2 puff (2 puffs Inhalation Given 04/09/15 1249)  potassium chloride SA (K-DUR,KLOR-CON) CR tablet 40 mEq (not administered)  sodium chloride 0.9 % bolus 1,000 mL (1,000 mLs Intravenous New Bag/Given 04/09/15 1226)    Filed Vitals:   04/09/15 1035 04/09/15 1038 04/09/15 1128 04/09/15 1250  BP:  103/66 101/63   Pulse:  84 72   Temp:  97.9 F (36.6 C)    TempSrc:  Oral    Resp:  16 20   Height: 5\' 2"  (1.575 m)     Weight: 210 lb (95.255 kg)     SpO2:  100% 100% 100%   EKG noted sinus rhythm with marked sinus arrhythmia with a heart rate 78 bpm. Troponin negative elevation. D-dimer negative elevation. CBC negative elevated leukocytosis. Hemoglobin 12.0, hematocrit 36.4. CMP noted mildly low potassium of 3.4-otherwise unremarkable. Urine pregnancy negative. Chest x-ray no active cardiopulmonary disease. Doubt PE-negative elevated d-dimer - PERC score low. Doubt ACS - low risk factors and young healthy female. HEART score 1. Doubt asthma exacerbation-patient resting comfortably, sitting upright in bed texting on cell phone without signs of respiratory distress. Negative findings of pneumonia. Definitive etiology of chest pain  unknown. Cannot rule out possible musculoskeletal pain - chest wall pain - secondary to pain being reproducible upon  palpation to chest wall and patient doing continuous heavy lifting. This can also be stress-induced/anxiety secondary to many stressors that patient has going on in her life at this moment. Patient stable, afebrile. Patient not septic appearing. Negative signs of respiratory distress. Discharged patient. Discharged patient with albuterol inhaler to use as needed for shortness of breath or wheezing since patient does have history of asthma. Discussed with patient to rest and stay hydrated. Discussed with patient to avoid any physical or strenuous activity-no heavy lifting. Discussed with patient to follow-up with [facility]PCP. Discussed with patient to closely monitor symptoms and if symptoms are to worsen or change to report back to the ED - strict return instructions given.  Patient agreed to plan of care, understood, all questions answered.   Jamse Mead, PA-C 04/09/15 [address] PA-C 04/09/15 [place], MD 04/09/15 1520

## 2015-04-09 NOTE — ED Notes (Signed)
Pt left inhaler in room. Pt called by Registration Debbie to pick it up in [facility].

## 2015-12-06 ENCOUNTER — Emergency Department (HOSPITAL_BASED_OUTPATIENT_CLINIC_OR_DEPARTMENT_OTHER)
Admission: EM | Admit: 2015-12-06 | Discharge: 2015-12-06 | Disposition: A | Discharge status: Home / Self Care | Payer: Medicare Other | Attending: Emergency Medicine | Admitting: Emergency Medicine

## 2015-12-06 ENCOUNTER — Encounter (HOSPITAL_BASED_OUTPATIENT_CLINIC_OR_DEPARTMENT_OTHER): Payer: Self-pay | Admitting: *Deleted

## 2015-12-06 ENCOUNTER — Emergency Department (HOSPITAL_BASED_OUTPATIENT_CLINIC_OR_DEPARTMENT_OTHER): Payer: Medicare Other

## 2015-12-06 DIAGNOSIS — R0789 Other chest pain: Secondary | ICD-10-CM

## 2015-12-06 DIAGNOSIS — R0602 Shortness of breath: Secondary | ICD-10-CM | POA: Diagnosis present

## 2015-12-06 DIAGNOSIS — M25512 Pain in left shoulder: Secondary | ICD-10-CM | POA: Insufficient documentation

## 2015-12-06 DIAGNOSIS — J9801 Acute bronchospasm: Secondary | ICD-10-CM

## 2015-12-06 DIAGNOSIS — F17210 Nicotine dependence, cigarettes, uncomplicated: Secondary | ICD-10-CM | POA: Insufficient documentation

## 2015-12-06 DIAGNOSIS — J45901 Unspecified asthma with (acute) exacerbation: Secondary | ICD-10-CM | POA: Diagnosis not present

## 2015-12-06 LAB — BASIC METABOLIC PANEL
Anion gap: 7 (ref 5–15)
BUN: 7 mg/dL (ref 6–20)
CHLORIDE: 108 mmol/L (ref 101–111)
CO2: 25 mmol/L (ref 22–32)
CREATININE: 0.67 mg/dL (ref 0.44–1.00)
Calcium: 8.6 mg/dL — ABNORMAL LOW (ref 8.9–10.3)
GFR calc Af Amer: >60 mL/min (ref >60)
GFR calc non Af Amer: >60 mL/min (ref >60)
Glucose, Bld: 94 mg/dL (ref 65–99)
POTASSIUM: 3.5 mmol/L (ref 3.5–5.1)
SODIUM: 140 mmol/L (ref 135–145)

## 2015-12-06 LAB — CBC
HEMATOCRIT: 37.5 % (ref 36.0–46.0)
Hemoglobin: 12.1 g/dL (ref 12.0–15.0)
MCH: 25.3 pg — AB (ref 26.0–34.0)
MCHC: 32.3 g/dL (ref 30.0–36.0)
MCV: 78.3 fL (ref 78.0–100.0)
PLATELETS: 195 10*3/uL (ref 150–400)
RBC: 4.79 MIL/uL (ref 3.87–5.11)
RDW: 15.3 % (ref 11.5–15.5)
WBC: 7.8 10*3/uL (ref 4.0–10.5)

## 2015-12-06 LAB — TROPONIN I: Troponin I: <0.03 ng/mL (ref <0.031)

## 2015-12-06 MED ORDER — ALBUTEROL SULFATE HFA 108 (90 BASE) MCG/ACT IN AERS
2.0000 | INHALATION_SPRAY | RESPIRATORY_TRACT | Status: DC | PRN
  Administered 2015-12-06: 2 via RESPIRATORY_TRACT
  Filled 2015-12-06: qty 6.7

## 2015-12-06 MED ORDER — NAPROXEN 500 MG PO TABS: 500.0000 mg | ORAL_TABLET | Freq: Two times a day (BID) | ORAL | Status: DC

## 2015-12-06 NOTE — ED Notes (Signed)
History of Asthma, took breathing inhaler prior to arrival with no relief. Pt taking short breaths but stable at present time.

## 2015-12-06 NOTE — ED Provider Notes (Signed)
CSN: [id number]     Arrival date & time 12/06/15  1745 History   First MD Initiated Contact with Patient 12/06/15 [place]     Chief Complaint  Patient presents with  . Shortness of Breath     (Consider location/radiation/quality/duration/timing/severity/associated sxs/prior Treatment) HPI  Pt presenting with c/o shortness of breath as well as pain in her left shoulder.  Pt states that she used her daughters albuterol inhaler with some relief of her shortness of breath.  She has hx of asthma but has not had problems with it recently and does not have her own inhaler.  She also states she feels chest soreness and soreness in her left shoulder.  Pain is worse with movement and palpation.  Symptoms began earlier today.  No fever, no cough.  No leg swelling.  No hx of DVT/PE, no OCPs, no recent travel/trauma/surgery.  There are no other associated systemic symptoms, there are no other alleviating or modifying factors.   Past Medical History  Diagnosis Date  . Asthma    History reviewed. No pertinent past surgical history. No family history on file. Social History  Substance Use Topics  . Smoking status: Current Every Day Smoker -- 0.50 packs/day    Types: Cigarettes  . Smokeless tobacco: None  . Alcohol Use: Yes     Comment: a few times a week   OB History    No data available     Review of Systems  ROS reviewed and all otherwise negative except for mentioned in HPI    Allergies  Review of patient's allergies indicates no known allergies.  Home Medications   Prior to Admission medications   Medication Sig Start Date End Date Taking? Authorizing Provider  ALBUTEROL IN Inhale into the lungs.   Yes Historical Provider, MD  naproxen (NAPROSYN) 500 MG tablet Take 1 tablet (500 mg total) by mouth 2 (two) times daily. 12/06/15   Alfonzo Beers, MD   BP 115/90 mmHg  Pulse 90  Temp(Src) 98.1 F (36.7 C) (Oral)  Resp 18  Ht 5\' 2"  (1.575 m)  Wt 198 lb 6 oz (89.982 kg)  BMI 36.27  kg/m2  SpO2 100%  LMP 11/10/2015 (Exact Date)  Vitals reviewed Physical Exam  Physical Examination: General appearance - alert, well appearing, and in no distress Mental status - alert, oriented to person, place, and time Eyes - no conjunctival injection, no scleral icterus Mouth - mucous membranes moist, pharynx normal without lesions Chest - clear to auscultation, no wheezes, rales or rhonchi, symmetric air entry, some ttp over left anterior chest wall Heart - normal rate, regular rhythm, normal S1, S2, no murmurs, rubs, clicks or gallops Abdomen - soft, nontender, nondistended, no masses or organomegaly Neurological - alert, oriented, normal speech Musculoskeletal- ttp over anterior left shoulder diffusely and pain with ROM of left shoulder  Extremities - peripheral pulses normal, no pedal edema, no clubbing or cyanosis Skin - normal coloration and turgor, no rashes  ED Course  Procedures (including critical care time) Labs Review Labs Reviewed  BASIC METABOLIC PANEL - Abnormal; Notable for the following:    Calcium 8.6 (*)    All other components within normal limits  CBC - Abnormal; Notable for the following:    MCH 25.3 (*)    All other components within normal limits  TROPONIN I    Imaging Review Dg Chest 2 View  12/06/2015  CLINICAL DATA:  Shortness of breath. Left arm and chest pain. Cough. EXAM: CHEST  2 VIEW  COMPARISON:  04/09/2015 FINDINGS: Midline trachea.  Normal heart size and mediastinal contours. Sharp costophrenic angles.  No pneumothorax.  Clear lungs. IMPRESSION: No active cardiopulmonary disease. Electronically Signed   By: Abigail Miyamoto M.D.   On: 12/06/2015 19:51   I have personally reviewed and evaluated these images and lab results as part of my medical decision-making.   EKG Interpretation   Date/Time:  Friday December 06 2015 18:01:53 EST Ventricular Rate:  83 PR Interval:  220 QRS Duration: 72 QT Interval:  380 QTC Calculation: 446 R Axis:    80 Text Interpretation:  Sinus rhythm with 1st degree A-V block Otherwise  normal ECG Since previous tracing PR has lengthened Confirmed by Canary Brim   MD, Deslyn Cavenaugh [phone]) on 12/06/2015 6:09:09 PM      MDM   Final diagnoses:  Bronchospasm  Chest wall pain    Pt presenting with c/o shortness of breath, relieved after albuterol inhaler. No frank wheezing on exam but prolonged expiratory phase, normal respiratory effort.  ttp over left anterior chest wall as well as anterior left shoulder- most c/w musculoskeletal pain.  Pt treated with albuterol inhaler and advised to use this every 4 hours at home.  CXR and EKG are reassuring as well as labs.  Doubt ACS. Pt has PERC score of 0 so very low risk for PE.  Discharged with strict return precautions.  Pt agreeable with plan.    Alfonzo Beers, MD 12/06/15 2100

## 2015-12-06 NOTE — ED Notes (Signed)
Sob since 2pm. No relief with Albuterol inhaler. Pain in her chest, left arm and into her back.

## 2015-12-06 NOTE — Discharge Instructions (Signed)
Return to the ED with any concerns including difficulty breathing despite using albuterol 2 puffs every 4 hours, fainting, swelling of arms or legs, decreased level of alertness/lethargy, or any other alarming symptoms

## 2016-01-30 ENCOUNTER — Emergency Department (HOSPITAL_BASED_OUTPATIENT_CLINIC_OR_DEPARTMENT_OTHER)
Admission: EM | Admit: 2016-01-30 | Discharge: 2016-01-31 | Disposition: A | Discharge status: Home / Self Care | Payer: Medicare Other | Attending: Emergency Medicine | Admitting: Emergency Medicine

## 2016-01-30 ENCOUNTER — Emergency Department (HOSPITAL_BASED_OUTPATIENT_CLINIC_OR_DEPARTMENT_OTHER): Payer: Medicare Other

## 2016-01-30 ENCOUNTER — Encounter (HOSPITAL_BASED_OUTPATIENT_CLINIC_OR_DEPARTMENT_OTHER): Payer: Self-pay | Admitting: *Deleted

## 2016-01-30 DIAGNOSIS — J069 Acute upper respiratory infection, unspecified: Secondary | ICD-10-CM | POA: Diagnosis not present

## 2016-01-30 DIAGNOSIS — R059 Cough, unspecified: Secondary | ICD-10-CM

## 2016-01-30 DIAGNOSIS — Z3202 Encounter for pregnancy test, result negative: Secondary | ICD-10-CM | POA: Insufficient documentation

## 2016-01-30 DIAGNOSIS — R1110 Vomiting, unspecified: Secondary | ICD-10-CM | POA: Diagnosis not present

## 2016-01-30 DIAGNOSIS — Z79899 Other long term (current) drug therapy: Secondary | ICD-10-CM | POA: Insufficient documentation

## 2016-01-30 DIAGNOSIS — F17210 Nicotine dependence, cigarettes, uncomplicated: Secondary | ICD-10-CM | POA: Diagnosis not present

## 2016-01-30 DIAGNOSIS — R05 Cough: Secondary | ICD-10-CM

## 2016-01-30 DIAGNOSIS — J45909 Unspecified asthma, uncomplicated: Secondary | ICD-10-CM | POA: Diagnosis not present

## 2016-01-30 LAB — PREGNANCY, URINE: PREG TEST UR: NEGATIVE

## 2016-01-30 MED ORDER — AZITHROMYCIN 250 MG PO TABS: 250.0000 mg | ORAL_TABLET | Freq: Every day | ORAL | Status: DC

## 2016-01-30 MED ORDER — BENZONATATE 100 MG PO CAPS: 100.0000 mg | ORAL_CAPSULE | Freq: Three times a day (TID) | ORAL | Status: DC

## 2016-01-30 NOTE — ED Provider Notes (Signed)
CSN: [id number]     Arrival date & time 01/30/16  2042 History   First MD Initiated Contact with Patient 01/30/16 2232     Chief Complaint  Patient presents with  . Cough     (Consider location/radiation/quality/duration/timing/severity/associated sxs/prior Treatment) Patient is a 30 y.o. female presenting with cough. The history is provided by the patient and medical records. No language interpreter was used.  Cough Associated symptoms: sore throat   Associated symptoms: no chills, no fever, no headaches, no rash, no shortness of breath and no wheezing    Shanequia Jipson is a 30 y.o. female  with a PMH of asthma who presents to the Emergency Department complaining of productive cough x 1 week with worsening over the last two days. Patient states she has tried her home albuterol inhaler and naproxen with no relief in symptoms. Denies fever, shortness of breath, chest pain. Admits to one episode of post-tussive emesis today, nasal congestion, sore throat. No alleviating or aggravating factors noted.   Past Medical History  Diagnosis Date  . Asthma    History reviewed. No pertinent past surgical history. No family history on file. Social History  Substance Use Topics  . Smoking status: Current Every Day Smoker -- 0.50 packs/day    Types: Cigarettes  . Smokeless tobacco: None  . Alcohol Use: Yes     Comment: a few times a week   OB History    No data available     Review of Systems  Constitutional: Negative for fever and chills.  HENT: Positive for congestion and sore throat. Negative for trouble swallowing and voice change.   Eyes: Negative for visual disturbance.  Respiratory: Positive for cough. Negative for shortness of breath and wheezing.   Cardiovascular: Negative.   Gastrointestinal: Negative for nausea, vomiting and abdominal pain.  Genitourinary: Negative for dysuria.  Musculoskeletal: Negative for back pain and neck pain.  Skin: Negative for rash.  Neurological:  Negative for dizziness, weakness and headaches.      Allergies  Review of patient's allergies indicates no known allergies.  Home Medications   Prior to Admission medications   Medication Sig Start Date End Date Taking? Authorizing Provider  ALBUTEROL IN Inhale into the lungs.    Historical Provider, MD  azithromycin (ZITHROMAX) 250 MG tablet Take 1 tablet (250 mg total) by mouth daily. Take first 2 tablets together, then 1 every day until finished. 01/30/16   Ozella Almond Ward, PA-C  benzonatate (TESSALON) 100 MG capsule Take 1 capsule (100 mg total) by mouth every 8 (eight) hours. 01/30/16   Ozella Almond Ward, PA-C  naproxen (NAPROSYN) 500 MG tablet Take 1 tablet (500 mg total) by mouth 2 (two) times daily. 12/06/15   Alfonzo Beers, MD   BP 131/90 mmHg  Pulse 91  Temp(Src) 98.5 F (36.9 C) (Oral)  Resp 18  Ht 5\' 2"  (1.575 m)  Wt 89.812 kg  BMI 36.21 kg/m2  SpO2 100%  LMP 01/04/2016 Physical Exam  Constitutional: She is oriented to person, place, and time. She appears well-developed and well-nourished.  Alert, speaking in sentences, and in no acute distress  HENT:  Head: Normocephalic and atraumatic.  OP with erythema, no exudates, no edema.   Neck: Normal range of motion. Neck supple.  Cardiovascular: Normal rate, regular rhythm and normal heart sounds.  Exam reveals no gallop and no friction rub.   No murmur heard. Pulmonary/Chest: Effort normal and breath sounds normal. No respiratory distress. She has no wheezes. She has no rales.  Abdominal: Soft. Bowel sounds are normal. She exhibits no distension. There is no tenderness.  Musculoskeletal: She exhibits no edema.  Lymphadenopathy:    She has no cervical adenopathy.  Neurological: She is alert and oriented to person, place, and time.  Skin: Skin is warm and dry. No rash noted.  Psychiatric: She has a normal mood and affect. Her behavior is normal. Judgment and thought content normal.  Nursing note and vitals  reviewed.   ED Course  Procedures (including critical care time) Labs Review Labs Reviewed  PREGNANCY, URINE    Imaging Review Dg Chest 2 View  01/30/2016  CLINICAL DATA:  Acute onset of cough. Mid upper chest pain. Initial encounter. EXAM: CHEST  2 VIEW COMPARISON:  Chest radiograph performed 12/06/2015 FINDINGS: The lungs are well-aerated and clear. There is no evidence of focal opacification, pleural effusion or pneumothorax. The heart is normal in size; the mediastinal contour is within normal limits. No acute osseous abnormalities are seen. IMPRESSION: No acute cardiopulmonary process seen. Electronically Signed   By: Garald Balding M.D.   On: 01/30/2016 23:22   I have personally reviewed and evaluated these images and lab results as part of my medical decision-making.   EKG Interpretation None      MDM   Final diagnoses:  Cough  Upper respiratory infection   [place] Boye presents with one week history of worsening productive cough, post-tussive emesis x1 today. Pt. Has history of asthma and is a current every day smoker. Discussed importance of smoking cessation with pt. At length. CXR reviewed and shows no acute cardiopulm. Dz. Given patient's PMH, smoking status, and chronicity of symptoms, will tx with azithro. Home care instructions, follow up instructions, and return precautions given. All questions answered.   [facility], PA-C 01/31/16 0002  Orlie Dakin, MD 01/31/16 [place]ann Kief

## 2016-01-30 NOTE — ED Notes (Signed)
Cough for a week.  

## 2016-01-30 NOTE — Discharge Instructions (Signed)
1. Medications: flonase, mucinex, tessalon, azithromycin is your antibiotic, continue usual home medications 2. Treatment: rest, drink plenty of fluids, take tylenol or ibuprofen for fever control if needed.  3. Follow Up: Please follow up with your primary doctor in 3 days for discussion of your diagnoses and further evaluation after today's visit; if you do not have a primary care doctor use the resource guide provided to find one; Return to the ER for high fevers, difficulty breathing or other concerning symptoms

## 2016-01-30 NOTE — ED Notes (Signed)
Report given at this time from [place], [place]

## 2016-01-31 NOTE — ED Notes (Signed)
Pt verbalizes understanding of d/c instructions and denies any further needs at this time. 

## 2016-10-02 ENCOUNTER — Other Ambulatory Visit (HOSPITAL_BASED_OUTPATIENT_CLINIC_OR_DEPARTMENT_OTHER): Payer: Self-pay | Admitting: Internal Medicine

## 2016-10-02 DIAGNOSIS — N926 Irregular menstruation, unspecified: Secondary | ICD-10-CM

## 2016-10-11 ENCOUNTER — Ambulatory Visit (HOSPITAL_BASED_OUTPATIENT_CLINIC_OR_DEPARTMENT_OTHER)
Admission: RE | Admit: 2016-10-11 | Discharge: 2016-10-11 | Disposition: A | Discharge status: Home / Self Care | Payer: Medicare Other | Source: Ambulatory Visit | Attending: Internal Medicine | Admitting: Internal Medicine

## 2016-10-11 DIAGNOSIS — N926 Irregular menstruation, unspecified: Secondary | ICD-10-CM

## 2016-11-21 DIAGNOSIS — N3000 Acute cystitis without hematuria: Secondary | ICD-10-CM | POA: Insufficient documentation

## 2016-11-21 DIAGNOSIS — A4151 Sepsis due to Escherichia coli [E. coli]: Secondary | ICD-10-CM | POA: Insufficient documentation

## 2016-11-24 ENCOUNTER — Encounter (HOSPITAL_BASED_OUTPATIENT_CLINIC_OR_DEPARTMENT_OTHER): Payer: Self-pay | Admitting: Emergency Medicine

## 2016-11-24 ENCOUNTER — Emergency Department (HOSPITAL_BASED_OUTPATIENT_CLINIC_OR_DEPARTMENT_OTHER)
Admission: EM | Admit: 2016-11-24 | Discharge: 2016-11-24 | Disposition: A | Discharge status: Home / Self Care | Payer: Medicare Other | Attending: Emergency Medicine | Admitting: Emergency Medicine

## 2016-11-24 ENCOUNTER — Emergency Department (HOSPITAL_BASED_OUTPATIENT_CLINIC_OR_DEPARTMENT_OTHER): Payer: Medicare Other

## 2016-11-24 DIAGNOSIS — F17210 Nicotine dependence, cigarettes, uncomplicated: Secondary | ICD-10-CM | POA: Diagnosis not present

## 2016-11-24 DIAGNOSIS — R1013 Epigastric pain: Secondary | ICD-10-CM | POA: Diagnosis not present

## 2016-11-24 DIAGNOSIS — E876 Hypokalemia: Secondary | ICD-10-CM | POA: Insufficient documentation

## 2016-11-24 DIAGNOSIS — Z792 Long term (current) use of antibiotics: Secondary | ICD-10-CM | POA: Insufficient documentation

## 2016-11-24 DIAGNOSIS — J45909 Unspecified asthma, uncomplicated: Secondary | ICD-10-CM | POA: Insufficient documentation

## 2016-11-24 DIAGNOSIS — R102 Pelvic and perineal pain: Secondary | ICD-10-CM | POA: Diagnosis present

## 2016-11-24 DIAGNOSIS — R0602 Shortness of breath: Secondary | ICD-10-CM | POA: Diagnosis not present

## 2016-11-24 LAB — URINALYSIS, ROUTINE W REFLEX MICROSCOPIC
Bilirubin Urine: NEGATIVE
Glucose, UA: NEGATIVE mg/dL
KETONES UR: NEGATIVE mg/dL
NITRITE: NEGATIVE
PH: 7 (ref 5.0–8.0)
Protein, ur: NEGATIVE mg/dL
SPECIFIC GRAVITY, URINE: 1.011 (ref 1.005–1.030)

## 2016-11-24 LAB — HEPATIC FUNCTION PANEL
ALT: 11 U/L — AB (ref 14–54)
AST: 17 U/L (ref 15–41)
Albumin: 2.8 g/dL — ABNORMAL LOW (ref 3.5–5.0)
Alkaline Phosphatase: 73 U/L (ref 38–126)
BILIRUBIN DIRECT: 0.2 mg/dL (ref 0.1–0.5)
BILIRUBIN INDIRECT: 0.4 mg/dL (ref 0.3–0.9)
TOTAL PROTEIN: 6.5 g/dL (ref 6.5–8.1)
Total Bilirubin: 0.6 mg/dL (ref 0.3–1.2)

## 2016-11-24 LAB — CBC WITH DIFFERENTIAL/PLATELET
BASOS PCT: 0 %
Basophils Absolute: 0 10*3/uL (ref 0.0–0.1)
EOS ABS: 0.1 10*3/uL (ref 0.0–0.7)
Eosinophils Relative: 0 %
HEMATOCRIT: 26.8 % — AB (ref 36.0–46.0)
HEMOGLOBIN: 9.1 g/dL — AB (ref 12.0–15.0)
LYMPHS ABS: 1.1 10*3/uL (ref 0.7–4.0)
Lymphocytes Relative: 9 %
MCH: 25.5 pg — ABNORMAL LOW (ref 26.0–34.0)
MCHC: 34 g/dL (ref 30.0–36.0)
MCV: 75.1 fL — ABNORMAL LOW (ref 78.0–100.0)
MONOS PCT: 7 %
Monocytes Absolute: 0.9 10*3/uL (ref 0.1–1.0)
NEUTROS ABS: 10 10*3/uL — AB (ref 1.7–7.7)
NEUTROS PCT: 84 %
Platelets: 222 10*3/uL (ref 150–400)
RBC: 3.57 MIL/uL — AB (ref 3.87–5.11)
RDW: 15.2 % (ref 11.5–15.5)
WBC: 12.1 10*3/uL — AB (ref 4.0–10.5)

## 2016-11-24 LAB — BASIC METABOLIC PANEL
Anion gap: 8 (ref 5–15)
BUN: 5 mg/dL — ABNORMAL LOW (ref 6–20)
CHLORIDE: 110 mmol/L (ref 101–111)
CO2: 22 mmol/L (ref 22–32)
CREATININE: 0.6 mg/dL (ref 0.44–1.00)
Calcium: 8 mg/dL — ABNORMAL LOW (ref 8.9–10.3)
GFR calc non Af Amer: >60 mL/min (ref >60)
Glucose, Bld: 96 mg/dL (ref 65–99)
POTASSIUM: 2.7 mmol/L — AB (ref 3.5–5.1)
SODIUM: 140 mmol/L (ref 135–145)

## 2016-11-24 LAB — PREGNANCY, URINE: PREG TEST UR: NEGATIVE

## 2016-11-24 LAB — LIPASE, BLOOD: LIPASE: 22 U/L (ref 11–51)

## 2016-11-24 LAB — D-DIMER, QUANTITATIVE (NOT AT ARMC): D DIMER QUANT: 3.53 ug{FEU}/mL — AB (ref 0.00–0.50)

## 2016-11-24 LAB — URINE MICROSCOPIC-ADD ON

## 2016-11-24 MED ORDER — POTASSIUM CHLORIDE ER 20 MEQ PO TBCR: 20.0000 meq | EXTENDED_RELEASE_TABLET | Freq: Every day | ORAL | 0 refills | Status: DC

## 2016-11-24 MED ORDER — IOPAMIDOL (ISOVUE-370) INJECTION 76%
100.0000 mL | Freq: Once | INTRAVENOUS | Status: AC | PRN
  Administered 2016-11-24: 100 mL via INTRAVENOUS

## 2016-11-24 MED ORDER — OMEPRAZOLE 20 MG PO CPDR: 20.0000 mg | DELAYED_RELEASE_CAPSULE | Freq: Every day | ORAL | 0 refills | Status: AC

## 2016-11-24 MED ORDER — KETOROLAC TROMETHAMINE 30 MG/ML IJ SOLN
15.0000 mg | Freq: Once | INTRAMUSCULAR | Status: AC
  Administered 2016-11-24: 15 mg via INTRAVENOUS
  Filled 2016-11-24: qty 1

## 2016-11-24 MED ORDER — GI COCKTAIL ~~LOC~~
30.0000 mL | Freq: Once | ORAL | Status: AC
  Administered 2016-11-24: 30 mL via ORAL
  Filled 2016-11-24: qty 30

## 2016-11-24 MED ORDER — ONDANSETRON 4 MG PO TBDP: 4.0000 mg | ORAL_TABLET | Freq: Three times a day (TID) | ORAL | 0 refills | Status: DC | PRN

## 2016-11-24 MED ORDER — POTASSIUM CHLORIDE CRYS ER 20 MEQ PO TBCR
40.0000 meq | EXTENDED_RELEASE_TABLET | Freq: Once | ORAL | Status: AC
  Administered 2016-11-24: 40 meq via ORAL
  Filled 2016-11-24: qty 2

## 2016-11-24 MED ORDER — POTASSIUM CHLORIDE 10 MEQ/100ML IV SOLN
10.0000 meq | Freq: Once | INTRAVENOUS | Status: AC
  Administered 2016-11-24: 10 meq via INTRAVENOUS
  Filled 2016-11-24: qty 100

## 2016-11-24 NOTE — ED Notes (Signed)
Patient transported to X-ray 

## 2016-11-24 NOTE — ED Notes (Signed)
Patient transported to CT 

## 2016-11-24 NOTE — ED Triage Notes (Signed)
Patient states that she was seen and treated at [facility] on Saturday for pain to her lower pelvic region, SOB and Headache. Reports that she continues to have all these complaints and came for reevaluation.

## 2016-11-24 NOTE — ED Provider Notes (Signed)
[place] DEPT [facility] Provider Note   CSN: [id number] Arrival date & time: 11/24/16  0051     History   Chief Complaint Chief Complaint  Patient presents with  . Pelvic Pain    HPI Michelle Giles is a 30 y.o. female.  HPI  This is a 30 year old female who presents with persistent abdominal pain. Recent admission at [facility] regional from 12/2 to 12/4. At that time she was found to have a urinary tract infection with leukocytosis. Cultures did not speciate. She did get discharged home on antibiotics. She reports compliance with antibiotics. She reports persistent upper abdominal pain in the epigastrium. It is sharp and crampy. It is constant. No change since her pain started on Saturday. Current pain is 10 out of 10. She is taking ibuprofen at home with no relief. Denies back pain or urinary symptoms. Denies nausea, vomiting. Does endorse nonbloody diarrhea.  Patient denies chest pain. She does endorse some shortness of breath.  Based on chart review, patient had a CT scan of the abdomen and pelvis on 12/2. This was largely reassuring. Hemoglobin 8.7 and potassium 2.9 at discharge.  Past Medical History:  Diagnosis Date  . Asthma     There are no active problems to display for this patient.   History reviewed. No pertinent surgical history.  OB History    No data available       Home Medications    Prior to Admission medications   Medication Sig Start Date End Date Taking? Authorizing Provider  ciprofloxacin (CIPRO) 500 MG tablet Take 500 mg by mouth 2 (two) times daily.   Yes Historical Provider, MD  ALBUTEROL IN Inhale into the lungs.    Historical Provider, MD  azithromycin (ZITHROMAX) 250 MG tablet Take 1 tablet (250 mg total) by mouth daily. Take first 2 tablets together, then 1 every day until finished. 01/30/16   Ozella Almond Ward, PA-C  benzonatate (TESSALON) 100 MG capsule Take 1 capsule (100 mg total) by mouth every 8 (eight) hours. 01/30/16   Ozella Almond  Ward, PA-C  naproxen (NAPROSYN) 500 MG tablet Take 1 tablet (500 mg total) by mouth 2 (two) times daily. 12/06/15   Alfonzo Beers, MD  omeprazole (PRILOSEC) 20 MG capsule Take 1 capsule (20 mg total) by mouth daily. 11/24/16   Merryl Hacker, MD  ondansetron (ZOFRAN ODT) 4 MG disintegrating tablet Take 1 tablet (4 mg total) by mouth every 8 (eight) hours as needed for nausea or vomiting. 11/24/16   Merryl Hacker, MD  potassium chloride 20 MEQ TBCR Take 20 mEq by mouth daily. 11/24/16   Merryl Hacker, MD    Family History History reviewed. No pertinent family history.  Social History Social History  Substance Use Topics  . Smoking status: Current Every Day Smoker    Packs/day: 0.50    Types: Cigarettes  . Smokeless tobacco: Never Used  . Alcohol use Yes     Comment: a few times a week     Allergies   Patient has no known allergies.   Review of Systems Review of Systems  Constitutional: Negative for fever.  Respiratory: Positive for shortness of breath. Negative for cough.   Cardiovascular: Negative for chest pain.  Gastrointestinal: Positive for abdominal pain and diarrhea. Negative for vomiting.  Genitourinary: Negative for dysuria.  All other systems reviewed and are negative.    Physical Exam Updated Vital Signs BP 119/86   Pulse 86   Temp 98.4 F (36.9 C) (Oral)  Resp (!) 30   Ht 5\' 2"  (1.575 m)   Wt 229 lb (103.9 kg)   LMP 11/17/2016 Comment: (-) u pregnancy test//a.c.  SpO2 97%   BMI 41.88 kg/m   Physical Exam  Constitutional: She is oriented to person, place, and time. She appears well-developed and well-nourished. No distress.  HENT:  Head: Normocephalic and atraumatic.  Cardiovascular: Normal rate, regular rhythm and normal heart sounds.   No murmur heard. Pulmonary/Chest: Effort normal and breath sounds normal. No respiratory distress. She has no wheezes.  Abdominal: Soft. Bowel sounds are normal. There is tenderness. There is no guarding.    Epigastric and right upper quadrant tenderness to palpation without rebound or guarding  Neurological: She is alert and oriented to person, place, and time.  Skin: Skin is warm and dry.  Psychiatric: She has a normal mood and affect.  Nursing note and vitals reviewed.    ED Treatments / Results  Labs (all labs ordered are listed, but only abnormal results are displayed) Labs Reviewed  URINALYSIS, ROUTINE W REFLEX MICROSCOPIC (NOT AT [facility]) - Abnormal; Notable for the following:       Result Value   APPearance CLOUDY (*)    Hgb urine dipstick TRACE (*)    Leukocytes, UA SMALL (*)    All other components within normal limits  CBC WITH DIFFERENTIAL/PLATELET - Abnormal; Notable for the following:    WBC 12.1 (*)    RBC 3.57 (*)    Hemoglobin 9.1 (*)    HCT 26.8 (*)    MCV 75.1 (*)    MCH 25.5 (*)    Neutro Abs 10.0 (*)    All other components within normal limits  BASIC METABOLIC PANEL - Abnormal; Notable for the following:    Potassium 2.7 (*)    BUN 5 (*)    Calcium 8.0 (*)    All other components within normal limits  URINE MICROSCOPIC-ADD ON - Abnormal; Notable for the following:    Squamous Epithelial / LPF 6-30 (*)    Bacteria, UA MANY (*)    All other components within normal limits  HEPATIC FUNCTION PANEL - Abnormal; Notable for the following:    Albumin 2.8 (*)    ALT 11 (*)    All other components within normal limits  D-DIMER, QUANTITATIVE (NOT AT [facility]) - Abnormal; Notable for the following:    D-Dimer, Quant 3.53 (*)    All other components within normal limits  URINE CULTURE  PREGNANCY, URINE  LIPASE, BLOOD    EKG  EKG Interpretation  Date/Time:  Tuesday November 24 2016 03:05:33 EST Ventricular Rate:  101 PR Interval:    QRS Duration: 81 QT Interval:  355 QTC Calculation: 461 R Axis:   32 Text Interpretation:  Sinus tachycardia Prolonged PR interval Consider anterior infarct No significant change since last tracing Confirmed by Hriday Stai  MD, [place]  [phone]) on 11/24/2016 3:29:33 AM       Radiology Ct Angio Chest Pe W And/or Wo Contrast  Result Date: 11/24/2016 CLINICAL DATA:  Continuing dyspnea for 3 days. EXAM: CT ANGIOGRAPHY CHEST WITH CONTRAST TECHNIQUE: Multidetector CT imaging of the chest was performed using the standard protocol during bolus administration of intravenous contrast. Multiplanar CT image reconstructions and MIPs were obtained to evaluate the vascular anatomy. CONTRAST:  100 mL Isovue 370 intravenous COMPARISON:  None. FINDINGS: Cardiovascular: Satisfactory opacification of the pulmonary arteries to the segmental level. No evidence of pulmonary embolism. Normal heart size. No pericardial effusion. Mediastinum/Nodes: No enlarged  mediastinal, hilar, or axillary lymph nodes. Thyroid gland, trachea, and esophagus demonstrate no significant findings. Lungs/Pleura: Small bilateral pleural effusions. Patchy airspace opacities in the lung bases, atelectasis versus infectious infiltrate. Airways are patent and normal in caliber. Upper Abdomen: No acute abnormality. Musculoskeletal: No chest wall abnormality. No acute or significant osseous findings. Review of the MIP images confirms the above findings. IMPRESSION: 1. Negative for pulmonary embolism or other acute vascular event. 2. Small pleural effusions bilaterally, as well as patchy basilar airspace opacities. Leading considerations are pneumonia versus atelectasis. Electronically Signed   By: Andreas Newport M.D.   On: 11/24/2016 05:03   Dg Abdomen Acute W/chest  Result Date: 11/24/2016 CLINICAL DATA:  Lower abdominal pain, shortness of breath and headache. EXAM: DG ABDOMEN ACUTE W/ 1V CHEST COMPARISON:  Chest radiograph January 30, 2016 FINDINGS: Cardiomediastinal silhouette is normal. Bandlike density LEFT lung base. Lungs are otherwise clear, no pleural effusions. No pneumothorax. Soft tissue planes and included osseous structures are unremarkable. Bowel gas pattern is nondilated  and nonobstructive. Contrast in the stomach. Phleboliths project in the pelvis. No intra-abdominal mass effect, pathologic calcifications or free air. Soft tissue planes and included osseous structures are non-suspicious. IMPRESSION: LEFT lung base atelectasis. Normal bowel gas pattern. Electronically Signed   By: Elon Alas M.D.   On: 11/24/2016 03:59    Procedures Procedures (including critical care time)  Medications Ordered in ED Medications  ketorolac (TORADOL) 30 MG/ML injection 15 mg (15 mg Intravenous Given 11/24/16 0245)  potassium chloride 10 mEq in 100 mL IVPB (0 mEq Intravenous Stopped 11/24/16 0409)  potassium chloride SA (K-DUR,KLOR-CON) CR tablet 40 mEq (40 mEq Oral Given 11/24/16 0309)  gi cocktail (Maalox,Lidocaine,Donnatal) (30 mLs Oral Given 11/24/16 0309)  iopamidol (ISOVUE-370) 76 % injection 100 mL (100 mLs Intravenous Contrast Given 11/24/16 0411)     Initial Impression / Assessment and Plan / ED Course  I have reviewed the triage vital signs and the nursing notes.  Pertinent labs & imaging results that were available during my care of the patient were reviewed by me and considered in my medical decision making (see chart for details).  Clinical Course     Patient presents with persistent abdominal pain, diarrhea, shortness of breath. Nontoxic on exam. Recent admission for urinary tract infection at regional. Negative abdominal imaging at that time. Improved leukocytosis from discharge. Persistent hypokalemia at 2.7. Patient was given both IV and oral potassium. She was given a GI cocktail given that her pain is in the epigastrium. Lipase is normal. LFTs reassuring. D-dimer was obtained given reports of shortness of breath and location of pain. D-dimer is positive. CT angiogram of the chest obtained and negative for PE. It does show atelectasis versus pneumonia on CT. Favor atelectasis. Patient is without cough. Currently on Cipro for urinary tract infection. I  discussed workup with the patient. She remains nontoxic.  Will initiate PPI. GI follow-up if symptoms persist.  After history, exam, and medical workup I feel the patient has been appropriately medically screened and is safe for discharge home. Pertinent diagnoses were discussed with the patient. Patient was given return precautions.   Final Clinical Impressions(s) / ED Diagnoses   Final diagnoses:  Epigastric pain  Hypokalemia    New Prescriptions New Prescriptions   OMEPRAZOLE (PRILOSEC) 20 MG CAPSULE    Take 1 capsule (20 mg total) by mouth daily.   ONDANSETRON (ZOFRAN ODT) 4 MG DISINTEGRATING TABLET    Take 1 tablet (4 mg total) by mouth every 8 (  eight) hours as needed for nausea or vomiting.   POTASSIUM CHLORIDE 20 MEQ TBCR    Take 20 mEq by mouth daily.     Merryl Hacker, MD 11/24/16 [phone]

## 2016-11-24 NOTE — ED Notes (Signed)
ED Provider at bedside. 

## 2016-11-25 LAB — URINE CULTURE: CULTURE: NO GROWTH

## 2017-01-28 DIAGNOSIS — N870 Mild cervical dysplasia: Secondary | ICD-10-CM | POA: Diagnosis not present

## 2017-01-28 DIAGNOSIS — Z87898 Personal history of other specified conditions: Secondary | ICD-10-CM | POA: Diagnosis not present

## 2017-01-28 DIAGNOSIS — Z1289 Encounter for screening for malignant neoplasm of other sites: Secondary | ICD-10-CM | POA: Diagnosis not present

## 2017-01-28 DIAGNOSIS — R87610 Atypical squamous cells of undetermined significance on cytologic smear of cervix (ASC-US): Secondary | ICD-10-CM | POA: Diagnosis not present

## 2017-01-28 DIAGNOSIS — Z01411 Encounter for gynecological examination (general) (routine) with abnormal findings: Secondary | ICD-10-CM | POA: Diagnosis not present

## 2017-01-28 DIAGNOSIS — Z113 Encounter for screening for infections with a predominantly sexual mode of transmission: Secondary | ICD-10-CM | POA: Diagnosis not present

## 2017-02-17 DIAGNOSIS — Z3201 Encounter for pregnancy test, result positive: Secondary | ICD-10-CM | POA: Diagnosis not present

## 2017-02-17 DIAGNOSIS — R87810 Cervical high risk human papillomavirus (HPV) DNA test positive: Secondary | ICD-10-CM | POA: Diagnosis not present

## 2017-02-17 DIAGNOSIS — Z3200 Encounter for pregnancy test, result unknown: Secondary | ICD-10-CM | POA: Diagnosis not present

## 2017-02-17 DIAGNOSIS — R87610 Atypical squamous cells of undetermined significance on cytologic smear of cervix (ASC-US): Secondary | ICD-10-CM | POA: Diagnosis not present

## 2017-03-12 DIAGNOSIS — O99211 Obesity complicating pregnancy, first trimester: Secondary | ICD-10-CM | POA: Diagnosis not present

## 2017-03-12 DIAGNOSIS — O021 Missed abortion: Secondary | ICD-10-CM | POA: Diagnosis not present

## 2017-03-12 DIAGNOSIS — O209 Hemorrhage in early pregnancy, unspecified: Secondary | ICD-10-CM | POA: Diagnosis not present

## 2017-03-23 DIAGNOSIS — Z8759 Personal history of other complications of pregnancy, childbirth and the puerperium: Secondary | ICD-10-CM | POA: Diagnosis not present

## 2017-03-23 DIAGNOSIS — O200 Threatened abortion: Secondary | ICD-10-CM | POA: Diagnosis not present

## 2017-03-30 DIAGNOSIS — O021 Missed abortion: Secondary | ICD-10-CM | POA: Diagnosis not present

## 2017-04-07 DIAGNOSIS — O021 Missed abortion: Secondary | ICD-10-CM | POA: Diagnosis not present

## 2017-05-18 DIAGNOSIS — J302 Other seasonal allergic rhinitis: Secondary | ICD-10-CM | POA: Diagnosis not present

## 2017-05-18 DIAGNOSIS — H539 Unspecified visual disturbance: Secondary | ICD-10-CM | POA: Diagnosis not present

## 2017-05-18 DIAGNOSIS — Z113 Encounter for screening for infections with a predominantly sexual mode of transmission: Secondary | ICD-10-CM | POA: Diagnosis not present

## 2017-05-18 DIAGNOSIS — K219 Gastro-esophageal reflux disease without esophagitis: Secondary | ICD-10-CM | POA: Diagnosis not present

## 2017-05-18 DIAGNOSIS — J45909 Unspecified asthma, uncomplicated: Secondary | ICD-10-CM | POA: Diagnosis not present

## 2017-05-18 DIAGNOSIS — E559 Vitamin D deficiency, unspecified: Secondary | ICD-10-CM | POA: Diagnosis not present

## 2017-05-18 DIAGNOSIS — E669 Obesity, unspecified: Secondary | ICD-10-CM | POA: Diagnosis not present

## 2017-05-18 DIAGNOSIS — Z0110 Encounter for examination of ears and hearing without abnormal findings: Secondary | ICD-10-CM | POA: Diagnosis not present

## 2017-05-18 DIAGNOSIS — Z720 Tobacco use: Secondary | ICD-10-CM | POA: Diagnosis not present

## 2017-05-18 DIAGNOSIS — N926 Irregular menstruation, unspecified: Secondary | ICD-10-CM | POA: Diagnosis not present

## 2017-05-18 DIAGNOSIS — Z131 Encounter for screening for diabetes mellitus: Secondary | ICD-10-CM | POA: Diagnosis not present

## 2017-05-21 ENCOUNTER — Emergency Department (HOSPITAL_BASED_OUTPATIENT_CLINIC_OR_DEPARTMENT_OTHER)
Admission: EM | Admit: 2017-05-21 | Discharge: 2017-05-22 | Disposition: A | Discharge status: Home / Self Care | Payer: Medicare Other | Attending: Emergency Medicine | Admitting: Emergency Medicine

## 2017-05-21 ENCOUNTER — Encounter (HOSPITAL_BASED_OUTPATIENT_CLINIC_OR_DEPARTMENT_OTHER): Payer: Self-pay | Admitting: Emergency Medicine

## 2017-05-21 DIAGNOSIS — K047 Periapical abscess without sinus: Secondary | ICD-10-CM | POA: Diagnosis not present

## 2017-05-21 DIAGNOSIS — Z79899 Other long term (current) drug therapy: Secondary | ICD-10-CM | POA: Diagnosis not present

## 2017-05-21 DIAGNOSIS — F17210 Nicotine dependence, cigarettes, uncomplicated: Secondary | ICD-10-CM | POA: Diagnosis not present

## 2017-05-21 DIAGNOSIS — K0889 Other specified disorders of teeth and supporting structures: Secondary | ICD-10-CM | POA: Diagnosis present

## 2017-05-21 DIAGNOSIS — J45909 Unspecified asthma, uncomplicated: Secondary | ICD-10-CM | POA: Insufficient documentation

## 2017-05-21 NOTE — ED Triage Notes (Signed)
PT presents to ED with c/o right upper dental pain.

## 2017-05-22 DIAGNOSIS — K047 Periapical abscess without sinus: Secondary | ICD-10-CM | POA: Diagnosis not present

## 2017-05-22 MED ORDER — HYDROCODONE-ACETAMINOPHEN 5-325 MG PO TABS: 1.0000 | ORAL_TABLET | Freq: Four times a day (QID) | ORAL | 0 refills | Status: DC | PRN

## 2017-05-22 MED ORDER — PENICILLIN V POTASSIUM 250 MG PO TABS
500.0000 mg | ORAL_TABLET | Freq: Once | ORAL | Status: AC
  Administered 2017-05-22: 500 mg via ORAL
  Filled 2017-05-22: qty 2

## 2017-05-22 MED ORDER — PENICILLIN V POTASSIUM 500 MG PO TABS: 500.0000 mg | ORAL_TABLET | Freq: Four times a day (QID) | ORAL | 0 refills | Status: AC

## 2017-05-22 NOTE — ED Notes (Signed)
Pt verbalizes understanding of d/c instructions and denies any further needs at this time. 

## 2017-05-22 NOTE — ED Provider Notes (Signed)
[place] DEPT [facility] Provider Note: Georgena Spurling, MD, FACEP  CSN: [id number] MRN: [id number] ARRIVAL: 05/21/17 at 2257 ROOM: [place] is a 31 y.o. female who complains of pain and associated gum swelling of her right upper first molar. The pain has been present for the last 3-4 days. She rates it as an 8.5 out of 10. Pain is worse with eating or drinking. She has an appointment with her dentist in 4 days but is requesting pain control in the meantime. There is associated right anterior cervical lymphadenopathy.  Consultation with the [facility] state controlled substances database reveals the patient has received no opioid prescriptions in the past year..   Past Medical History:  Diagnosis Date  . Asthma     History reviewed. No pertinent surgical history.  No family history on file.  Social History  Substance Use Topics  . Smoking status: Current Every Day Smoker    Packs/day: 0.50    Types: Cigarettes  . Smokeless tobacco: Never Used  . Alcohol use Yes     Comment: a few times a week    Prior to Admission medications   Medication Sig Start Date End Date Taking? Authorizing Provider  ALBUTEROL IN Inhale into the lungs.    [provider]  azithromycin (ZITHROMAX) 250 MG tablet Take 1 tablet (250 mg total) by mouth daily. Take first 2 tablets together, then 1 every day until finished. 01/30/16   Ward, Ozella Almond, PA-C  benzonatate (TESSALON) 100 MG capsule Take 1 capsule (100 mg total) by mouth every 8 (eight) hours. 01/30/16   Ward, Ozella Almond, PA-C  ciprofloxacin (CIPRO) 500 MG tablet Take 500 mg by mouth 2 (two) times daily.    [provider]  naproxen (NAPROSYN) 500 MG tablet Take 1 tablet (500 mg total) by mouth 2 (two) times daily. 12/06/15   Alfonzo Beers, MD  omeprazole (PRILOSEC) 20 MG capsule Take 1 capsule (20 mg total) by mouth daily. 11/24/16   Horton,  Barbette Hair, MD  ondansetron (ZOFRAN ODT) 4 MG disintegrating tablet Take 1 tablet (4 mg total) by mouth every 8 (eight) hours as needed for nausea or vomiting. 11/24/16   Horton, Barbette Hair, MD  potassium chloride 20 MEQ TBCR Take 20 mEq by mouth daily. 11/24/16   Horton, Barbette Hair, MD    Allergies Patient has no known allergies.   REVIEW OF SYSTEMS  Negative except as noted here or in the History of Present Illness.   PHYSICAL EXAMINATION  Initial Vital Signs Blood pressure 109/79, pulse (!) 103, temperature 98.2 F (36.8 C), temperature source Oral, resp. rate 18, height 5\' 2"  (1.575 m), weight 100.7 kg (222 lb), last menstrual period 04/19/2017, SpO2 100 %.  Examination General: Well-developed, well-nourished female in no acute distress; appearance consistent with age of record HENT: normocephalic; atraumatic; right upper first molar with damaged amalgam restoration, tenderness and swelling of adjacent lingual gum soft tissue Eyes: pupils equal, round and reactive to light; extraocular muscles intact Neck: supple; right anterior cervical lymphadenopathy Heart: regular rate and rhythm Lungs: clear to auscultation bilaterally Abdomen: soft; nondistended Extremities: No deformity; full range of motion; pulses normal Neurologic: Awake, alert and oriented; motor function intact in all extremities and symmetric; no facial droop Skin: Warm and dry Psychiatric: Normal mood and affect   RESULTS  Summary of this visit's results, reviewed by myself:   EKG Interpretation  Date/Time:  Ventricular Rate:    PR Interval:    QRS Duration:   QT Interval:    QTC Calculation:   R Axis:     Text Interpretation:        Laboratory Studies: No results found for this or any previous visit (from the past 24 hour(s)). Imaging Studies: No results found.  ED COURSE  Nursing notes and initial vitals signs, including pulse oximetry, reviewed.  Vitals:   05/21/17 2312 05/21/17 2315  BP:   109/79  Pulse:  (!) 103  Resp:  18  Temp:  98.2 F (36.8 C)  TempSrc:  Oral  SpO2:  100%  Weight: 100.7 kg (222 lb)   Height: 5\' 2"  (1.575 m)     PROCEDURES    ED DIAGNOSES     ICD-9-CM ICD-10-CM   1. Dental abscess 522.5 K04.7        Annalisa Colonna, MD 05/22/17 [phone]

## 2017-07-06 DIAGNOSIS — N926 Irregular menstruation, unspecified: Secondary | ICD-10-CM | POA: Diagnosis not present

## 2017-07-06 DIAGNOSIS — K219 Gastro-esophageal reflux disease without esophagitis: Secondary | ICD-10-CM | POA: Diagnosis not present

## 2017-07-06 DIAGNOSIS — M545 Low back pain: Secondary | ICD-10-CM | POA: Diagnosis not present

## 2017-07-06 DIAGNOSIS — J302 Other seasonal allergic rhinitis: Secondary | ICD-10-CM | POA: Diagnosis not present

## 2017-07-06 DIAGNOSIS — Z3202 Encounter for pregnancy test, result negative: Secondary | ICD-10-CM | POA: Diagnosis not present

## 2017-07-06 DIAGNOSIS — Z5181 Encounter for therapeutic drug level monitoring: Secondary | ICD-10-CM | POA: Diagnosis not present

## 2017-07-06 DIAGNOSIS — E785 Hyperlipidemia, unspecified: Secondary | ICD-10-CM | POA: Diagnosis not present

## 2017-07-06 DIAGNOSIS — E669 Obesity, unspecified: Secondary | ICD-10-CM | POA: Diagnosis not present

## 2017-07-06 DIAGNOSIS — J45909 Unspecified asthma, uncomplicated: Secondary | ICD-10-CM | POA: Diagnosis not present

## 2017-07-06 DIAGNOSIS — E559 Vitamin D deficiency, unspecified: Secondary | ICD-10-CM | POA: Diagnosis not present

## 2017-07-06 DIAGNOSIS — Z720 Tobacco use: Secondary | ICD-10-CM | POA: Diagnosis not present

## 2017-08-03 DIAGNOSIS — E559 Vitamin D deficiency, unspecified: Secondary | ICD-10-CM | POA: Diagnosis not present

## 2017-08-03 DIAGNOSIS — E669 Obesity, unspecified: Secondary | ICD-10-CM | POA: Diagnosis not present

## 2017-08-03 DIAGNOSIS — E785 Hyperlipidemia, unspecified: Secondary | ICD-10-CM | POA: Diagnosis not present

## 2017-08-03 DIAGNOSIS — K219 Gastro-esophageal reflux disease without esophagitis: Secondary | ICD-10-CM | POA: Diagnosis not present

## 2017-08-03 DIAGNOSIS — N926 Irregular menstruation, unspecified: Secondary | ICD-10-CM | POA: Diagnosis not present

## 2017-08-03 DIAGNOSIS — J45909 Unspecified asthma, uncomplicated: Secondary | ICD-10-CM | POA: Diagnosis not present

## 2017-08-03 DIAGNOSIS — Z720 Tobacco use: Secondary | ICD-10-CM | POA: Diagnosis not present

## 2017-08-03 DIAGNOSIS — J302 Other seasonal allergic rhinitis: Secondary | ICD-10-CM | POA: Diagnosis not present

## 2017-08-31 DIAGNOSIS — E785 Hyperlipidemia, unspecified: Secondary | ICD-10-CM | POA: Diagnosis not present

## 2017-08-31 DIAGNOSIS — J302 Other seasonal allergic rhinitis: Secondary | ICD-10-CM | POA: Diagnosis not present

## 2017-08-31 DIAGNOSIS — Z720 Tobacco use: Secondary | ICD-10-CM | POA: Diagnosis not present

## 2017-08-31 DIAGNOSIS — E669 Obesity, unspecified: Secondary | ICD-10-CM | POA: Diagnosis not present

## 2017-08-31 DIAGNOSIS — N926 Irregular menstruation, unspecified: Secondary | ICD-10-CM | POA: Diagnosis not present

## 2017-08-31 DIAGNOSIS — E559 Vitamin D deficiency, unspecified: Secondary | ICD-10-CM | POA: Diagnosis not present

## 2017-08-31 DIAGNOSIS — K219 Gastro-esophageal reflux disease without esophagitis: Secondary | ICD-10-CM | POA: Diagnosis not present

## 2017-08-31 DIAGNOSIS — J45909 Unspecified asthma, uncomplicated: Secondary | ICD-10-CM | POA: Diagnosis not present

## 2017-10-29 DIAGNOSIS — Z720 Tobacco use: Secondary | ICD-10-CM | POA: Diagnosis not present

## 2017-10-29 DIAGNOSIS — H1031 Unspecified acute conjunctivitis, right eye: Secondary | ICD-10-CM | POA: Diagnosis not present

## 2017-10-29 DIAGNOSIS — K219 Gastro-esophageal reflux disease without esophagitis: Secondary | ICD-10-CM | POA: Diagnosis not present

## 2017-10-29 DIAGNOSIS — N926 Irregular menstruation, unspecified: Secondary | ICD-10-CM | POA: Diagnosis not present

## 2017-10-29 DIAGNOSIS — E669 Obesity, unspecified: Secondary | ICD-10-CM | POA: Diagnosis not present

## 2017-10-29 DIAGNOSIS — J302 Other seasonal allergic rhinitis: Secondary | ICD-10-CM | POA: Diagnosis not present

## 2017-10-29 DIAGNOSIS — E559 Vitamin D deficiency, unspecified: Secondary | ICD-10-CM | POA: Diagnosis not present

## 2017-10-29 DIAGNOSIS — J45909 Unspecified asthma, uncomplicated: Secondary | ICD-10-CM | POA: Diagnosis not present

## 2017-10-29 DIAGNOSIS — L03211 Cellulitis of face: Secondary | ICD-10-CM | POA: Diagnosis not present

## 2017-10-29 DIAGNOSIS — E785 Hyperlipidemia, unspecified: Secondary | ICD-10-CM | POA: Diagnosis not present

## 2017-12-02 ENCOUNTER — Emergency Department (HOSPITAL_BASED_OUTPATIENT_CLINIC_OR_DEPARTMENT_OTHER)
Admission: EM | Admit: 2017-12-02 | Discharge: 2017-12-03 | Disposition: A | Discharge status: Home / Self Care | Payer: Medicare Other | Attending: Emergency Medicine | Admitting: Emergency Medicine

## 2017-12-02 ENCOUNTER — Encounter (HOSPITAL_BASED_OUTPATIENT_CLINIC_OR_DEPARTMENT_OTHER): Payer: Self-pay | Admitting: *Deleted

## 2017-12-02 ENCOUNTER — Other Ambulatory Visit: Payer: Self-pay

## 2017-12-02 DIAGNOSIS — J45909 Unspecified asthma, uncomplicated: Secondary | ICD-10-CM | POA: Diagnosis not present

## 2017-12-02 DIAGNOSIS — F17210 Nicotine dependence, cigarettes, uncomplicated: Secondary | ICD-10-CM | POA: Insufficient documentation

## 2017-12-02 DIAGNOSIS — R21 Rash and other nonspecific skin eruption: Secondary | ICD-10-CM | POA: Diagnosis present

## 2017-12-02 DIAGNOSIS — Z79899 Other long term (current) drug therapy: Secondary | ICD-10-CM | POA: Insufficient documentation

## 2017-12-02 DIAGNOSIS — B019 Varicella without complication: Secondary | ICD-10-CM | POA: Diagnosis not present

## 2017-12-02 LAB — URINALYSIS, ROUTINE W REFLEX MICROSCOPIC
Glucose, UA: NEGATIVE mg/dL
Hgb urine dipstick: NEGATIVE
KETONES UR: 15 mg/dL — AB
LEUKOCYTES UA: NEGATIVE
NITRITE: NEGATIVE
Protein, ur: NEGATIVE mg/dL
Specific Gravity, Urine: 1.025 (ref 1.005–1.030)
pH: 6.5 (ref 5.0–8.0)

## 2017-12-02 LAB — PREGNANCY, URINE: Preg Test, Ur: NEGATIVE

## 2017-12-02 MED ORDER — VALACYCLOVIR HCL 500 MG PO TABS
1000.0000 mg | ORAL_TABLET | Freq: Once | ORAL | Status: AC
  Administered 2017-12-03: 1000 mg via ORAL
  Filled 2017-12-02: qty 2

## 2017-12-02 MED ORDER — ONDANSETRON 4 MG PO TBDP
4.0000 mg | ORAL_TABLET | Freq: Once | ORAL | Status: AC
  Administered 2017-12-02: 4 mg via ORAL
  Filled 2017-12-02: qty 1

## 2017-12-02 MED ORDER — ONDANSETRON HCL 4 MG PO TABS: 4.0000 mg | ORAL_TABLET | Freq: Four times a day (QID) | ORAL | 0 refills | Status: DC | PRN

## 2017-12-02 MED ORDER — VALACYCLOVIR HCL 1 G PO TABS: 1000.0000 mg | ORAL_TABLET | Freq: Three times a day (TID) | ORAL | 0 refills | Status: DC

## 2017-12-02 MED ORDER — ACETAMINOPHEN 500 MG PO TABS
1000.0000 mg | ORAL_TABLET | Freq: Once | ORAL | Status: AC
  Administered 2017-12-02: 1000 mg via ORAL
  Filled 2017-12-02: qty 2

## 2017-12-02 NOTE — Discharge Instructions (Signed)
Take acetaminophen as needed for fever. Take diphenhydramine as needed for itching.

## 2017-12-02 NOTE — ED Provider Notes (Signed)
[place] EMERGENCY DEPARTMENT Provider Note   CSN: [id number] Arrival date & time: 12/02/17  2119     History   Chief Complaint Chief Complaint  Patient presents with  . Rash    HPI Michelle Giles is a 31 y.o. female.  The history is provided by the patient.  This morning, she noted that she had broken out in a rash on her face.  There are bumps which are itchy.  There are also bumps in the trunk, arms, legs.  She had a mild headache yesterday and continues to have a headache today.  She had subjective fever without chills and sweats.  She denies rhinorrhea, sore throat, cough.  There is been some nausea but no vomiting and no diarrhea.  She denies arthralgias or myalgias.  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  Of note, patient states that she had chickenpox when she was 31 years old, and also states that she had measles as a child.  As far she knows, she had received all of the recommended immunizations.  Past Medical History:  Diagnosis Date  . Asthma     There are no active problems to display for this patient.   History reviewed. No pertinent surgical history.  OB History    No data available       Home Medications    Prior to Admission medications   Medication Sig Start Date End Date Taking? Authorizing Provider  ALBUTEROL IN Inhale into the lungs.   Yes [provider]  omeprazole (PRILOSEC) 20 MG capsule Take 1 capsule (20 mg total) by mouth daily. 11/24/16  Yes Horton, Barbette Hair, MD  potassium chloride 20 MEQ TBCR Take 20 mEq by mouth daily. 11/24/16  Yes Horton, Barbette Hair, MD  HYDROcodone-acetaminophen (NORCO) 5-325 MG tablet Take 1 tablet by mouth every 6 (six) hours as needed (for pain). 05/22/17   Molpus, John, MD  ondansetron (ZOFRAN ODT) 4 MG disintegrating tablet Take 1 tablet (4 mg total) by mouth every 8 (eight) hours as needed for nausea or vomiting. 11/24/16   Horton, Barbette Hair, MD    Family History No family history on file.  Social History Social History   Tobacco Use  . Smoking status: Current Every Day Smoker    Packs/day: 0.50    Types: Cigarettes  . Smokeless tobacco: Never Used  Substance Use Topics  . Alcohol use: Yes    Comment: a few times a week  . Drug use: No     Allergies   Patient has no known allergies.   Review of Systems Review of Systems  All other systems reviewed and are negative.    Physical Exam Updated Vital Signs BP 115/74 (BP Location: Left Arm)   Pulse 97   Temp 99.6 F (37.6 C) (Oral)   Resp 18   Ht 5\' 2"  (1.575 m)   Wt 100.7 kg (222 lb)   LMP 11/25/2017   SpO2 99%   BMI 40.60 kg/m   Physical Exam  Nursing note and vitals reviewed.  31 year old female, resting comfortably and in no acute distress. Vital signs are normal. Oxygen saturation is 99%, which is normal. Head is normocephalic and atraumatic. PERRLA, EOMI. Oropharynx is clear. Neck is nontender and supple without adenopathy or JVD. Back is nontender and there is no CVA tenderness. Lungs are clear without rales, wheezes, or rhonchi. Chest is nontender. Heart has regular rate and  rhythm without murmur. Abdomen is soft, flat, nontender without masses or hepatosplenomegaly and peristalsis is normoactive. Extremities have no cyanosis or edema, full range of motion is present. Skin is warm and dry: There are multiple raised lesions with umbilicated centers.  Overall picture seems most consistent with varicella. Neurologic: Mental status is normal, cranial nerves are intact, there are no motor or sensory deficits.  ED Treatments / Results  Labs (all labs ordered are listed, but only abnormal results are displayed) Labs Reviewed  URINALYSIS, ROUTINE W REFLEX MICROSCOPIC - Abnormal; Notable for the following components:      Result Value   Bilirubin Urine SMALL (*)    Ketones, ur 15 (*)    All other components within normal limits  PREGNANCY,  URINE    Procedures Procedures (including critical care time)  Medications Ordered in ED Medications  ondansetron (ZOFRAN-ODT) disintegrating tablet 4 mg (4 mg Oral Given 12/02/17 2336)  acetaminophen (TYLENOL) tablet 1,000 mg (1,000 mg Oral Given 12/02/17 2336)     Initial Impression / Assessment and Plan / ED Course  I have reviewed the triage vital signs and the nursing notes.  Pertinent lab results that were available during my care of the patient were reviewed by me and considered in my medical decision making (see chart for details).  Varus sella.  She is discharged with prescription for valacyclovir.  She works as a Environmental education officer.  She needs to be off work until all lesions are crusted over and dry.  She is given a work release for 7 days.  Follow-up with PCP.  Final Clinical Impressions(s) / ED Diagnoses   Final diagnoses:  Varicella without complication    ED Discharge Orders        Ordered    valACYclovir (VALTREX) 1000 MG tablet  3 times daily     12/02/17 2352    ondansetron (ZOFRAN) 4 MG tablet  Every 6 hours PRN     16/10/96 0454       Delora Fuel, MD 09/81/19 2357

## 2017-12-02 NOTE — ED Triage Notes (Signed)
She woke this am with a rash on her face that has spread to her neck. Nausea, headache, back pain.

## 2017-12-03 DIAGNOSIS — B019 Varicella without complication: Secondary | ICD-10-CM | POA: Diagnosis not present

## 2017-12-03 NOTE — ED Notes (Signed)
Pt verbalizes understanding of d/c instructions and denies any further needs at this time. 

## 2017-12-06 DIAGNOSIS — E669 Obesity, unspecified: Secondary | ICD-10-CM | POA: Diagnosis not present

## 2017-12-06 DIAGNOSIS — R21 Rash and other nonspecific skin eruption: Secondary | ICD-10-CM | POA: Diagnosis not present

## 2017-12-06 DIAGNOSIS — E559 Vitamin D deficiency, unspecified: Secondary | ICD-10-CM | POA: Diagnosis not present

## 2017-12-06 DIAGNOSIS — N926 Irregular menstruation, unspecified: Secondary | ICD-10-CM | POA: Diagnosis not present

## 2017-12-06 DIAGNOSIS — K219 Gastro-esophageal reflux disease without esophagitis: Secondary | ICD-10-CM | POA: Diagnosis not present

## 2017-12-06 DIAGNOSIS — Z720 Tobacco use: Secondary | ICD-10-CM | POA: Diagnosis not present

## 2017-12-06 DIAGNOSIS — E785 Hyperlipidemia, unspecified: Secondary | ICD-10-CM | POA: Diagnosis not present

## 2017-12-06 DIAGNOSIS — J302 Other seasonal allergic rhinitis: Secondary | ICD-10-CM | POA: Diagnosis not present

## 2017-12-06 DIAGNOSIS — Z113 Encounter for screening for infections with a predominantly sexual mode of transmission: Secondary | ICD-10-CM | POA: Diagnosis not present

## 2017-12-06 DIAGNOSIS — J45909 Unspecified asthma, uncomplicated: Secondary | ICD-10-CM | POA: Diagnosis not present

## 2018-03-10 DIAGNOSIS — Z113 Encounter for screening for infections with a predominantly sexual mode of transmission: Secondary | ICD-10-CM | POA: Diagnosis not present

## 2018-03-10 DIAGNOSIS — E669 Obesity, unspecified: Secondary | ICD-10-CM | POA: Diagnosis not present

## 2018-03-10 DIAGNOSIS — K219 Gastro-esophageal reflux disease without esophagitis: Secondary | ICD-10-CM | POA: Diagnosis not present

## 2018-03-10 DIAGNOSIS — Z720 Tobacco use: Secondary | ICD-10-CM | POA: Diagnosis not present

## 2018-03-10 DIAGNOSIS — N926 Irregular menstruation, unspecified: Secondary | ICD-10-CM | POA: Diagnosis not present

## 2018-03-10 DIAGNOSIS — J45909 Unspecified asthma, uncomplicated: Secondary | ICD-10-CM | POA: Diagnosis not present

## 2018-03-10 DIAGNOSIS — E559 Vitamin D deficiency, unspecified: Secondary | ICD-10-CM | POA: Diagnosis not present

## 2018-03-10 DIAGNOSIS — E785 Hyperlipidemia, unspecified: Secondary | ICD-10-CM | POA: Diagnosis not present

## 2018-03-10 DIAGNOSIS — J302 Other seasonal allergic rhinitis: Secondary | ICD-10-CM | POA: Diagnosis not present

## 2018-03-14 DIAGNOSIS — Z01419 Encounter for gynecological examination (general) (routine) without abnormal findings: Secondary | ICD-10-CM | POA: Diagnosis not present

## 2018-03-18 DIAGNOSIS — R87610 Atypical squamous cells of undetermined significance on cytologic smear of cervix (ASC-US): Secondary | ICD-10-CM | POA: Diagnosis not present

## 2018-03-18 DIAGNOSIS — Z01411 Encounter for gynecological examination (general) (routine) with abnormal findings: Secondary | ICD-10-CM | POA: Diagnosis not present

## 2018-04-25 IMAGING — CT CT ANGIO CHEST
2 of 6 series · 19 of 36 positions shown · IV contrast (isovue)
Comparison: None.

CLINICAL DATA: Continuing dyspnea for 3 days.

EXAM:
CT ANGIOGRAPHY CHEST WITH CONTRAST
TECHNIQUE: Multidetector CT imaging of the chest was performed using the
standard protocol during bolus administration of intravenous
contrast. Multiplanar CT image reconstructions and MIPs were
obtained to evaluate the vascular anatomy.
CONTRAST:  100 mL Isovue 370 intravenous

[Series 6: pe coronal mpr · coronal · 0.52mm/px · 1 of 59 slices shown]
[im 30/59  mediastinal]
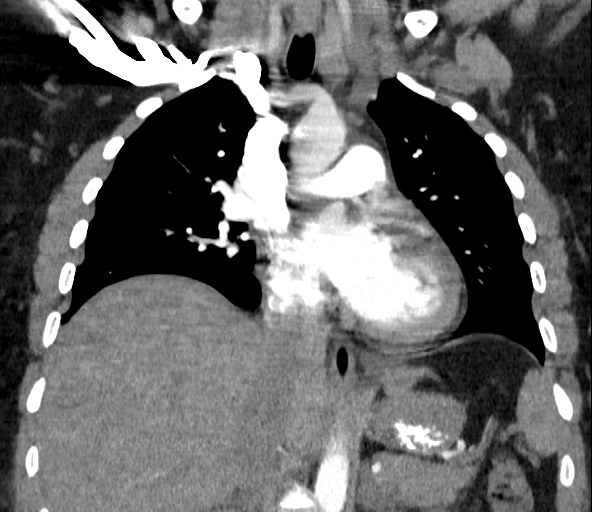

[Series 9: pe thins · axial · 0.71mm/px · z∈[-261,-22]mm · 18 of 353 slices shown]
[im 17/353  lung]
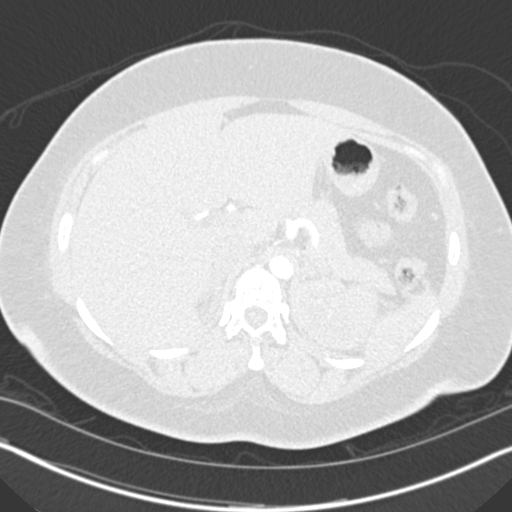
[im 34/353  mediastinal]
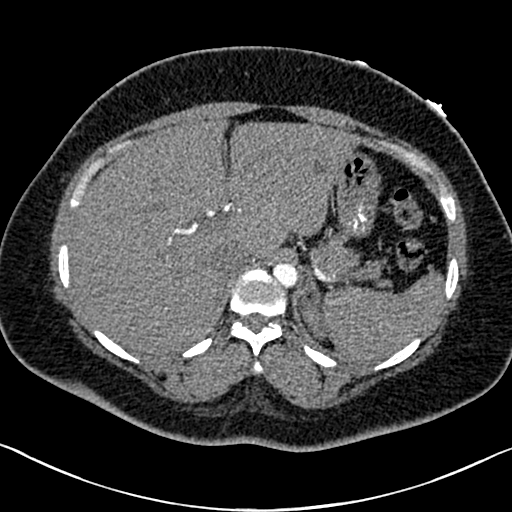
[im 51/353  lung]
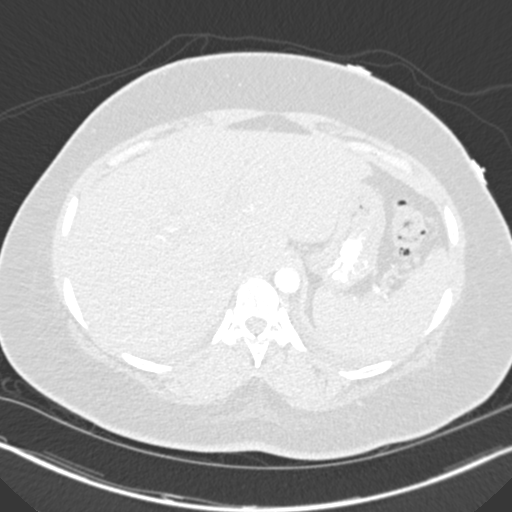
[im 68/353  mediastinal]
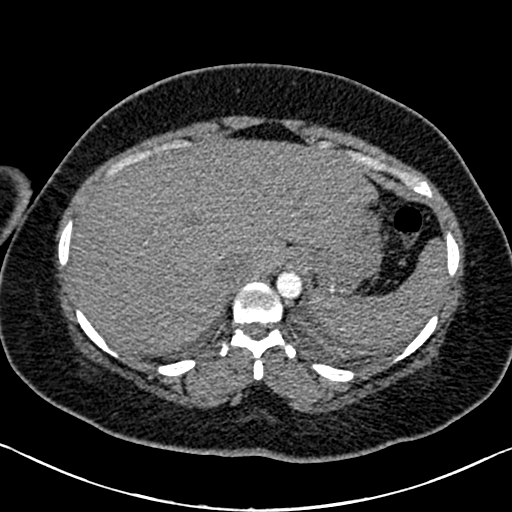
[im 101/353  lung]
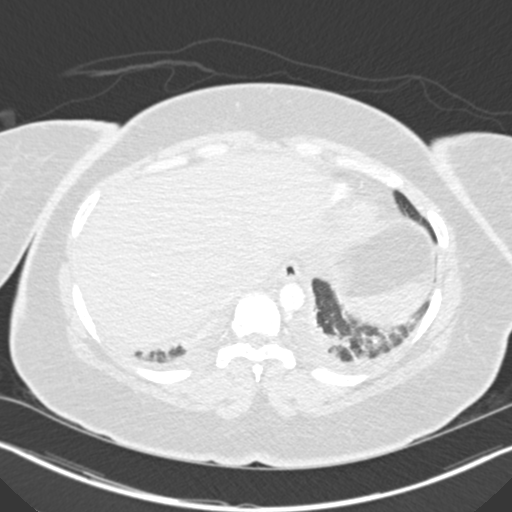
[im 118/353  mediastinal]
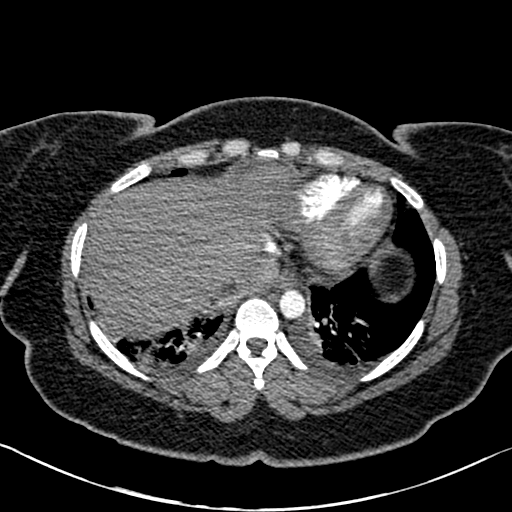
[im 135/353  lung]
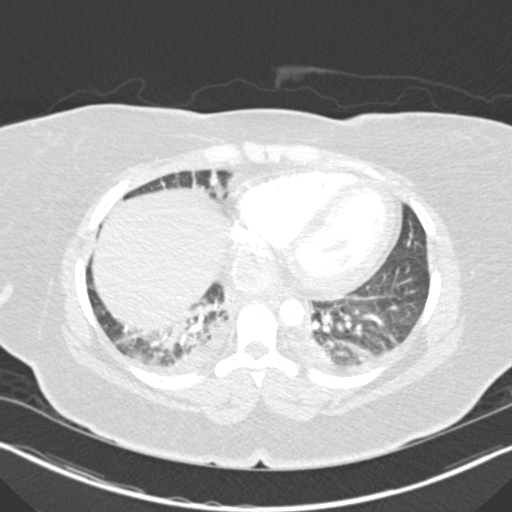
[im 151/353  mediastinal]
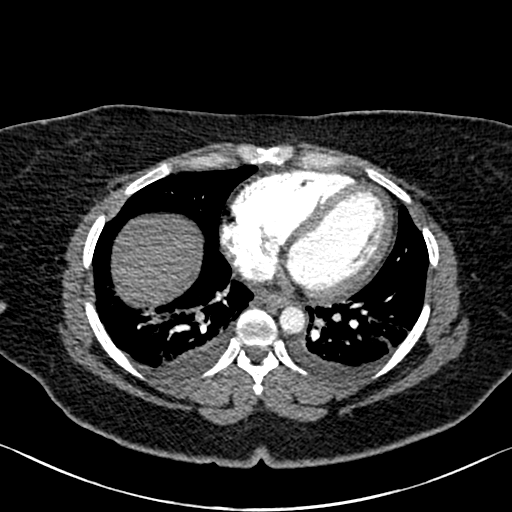
[im 168/353  lung]
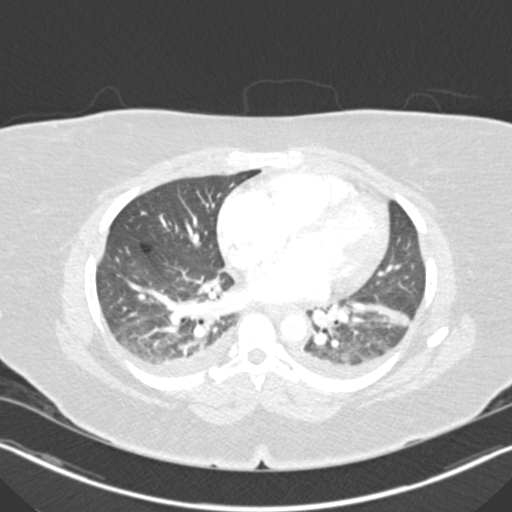
[im 185/353  mediastinal]
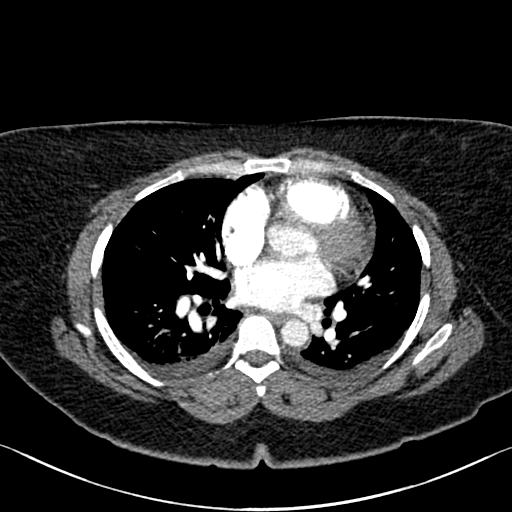
[im 202/353  lung]
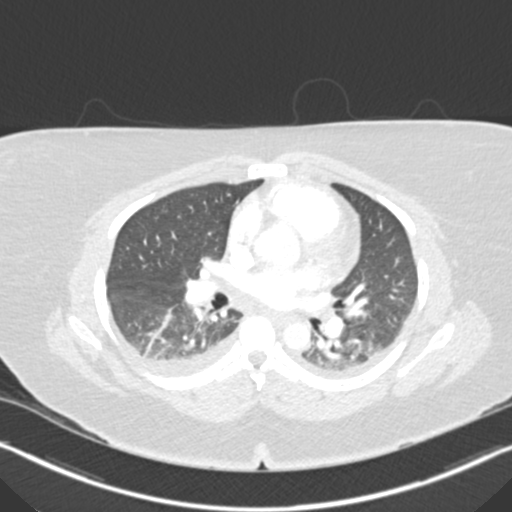
[im 218/353  mediastinal]
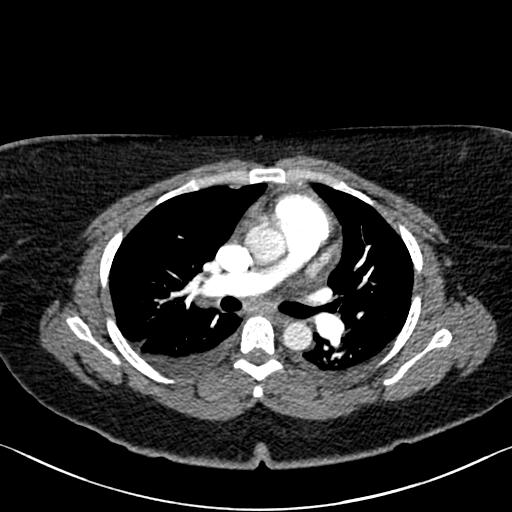
[im 235/353  lung]
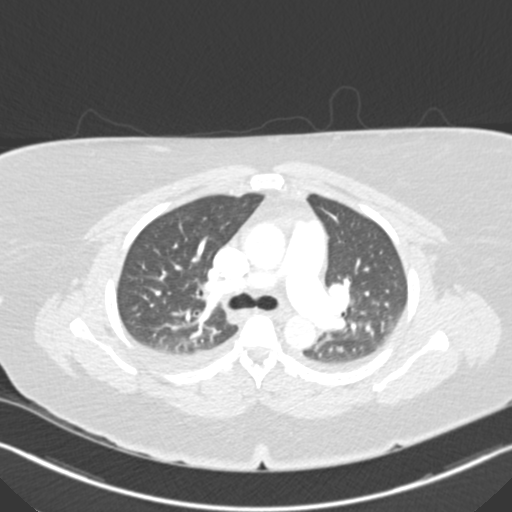
[im 269/353  mediastinal]
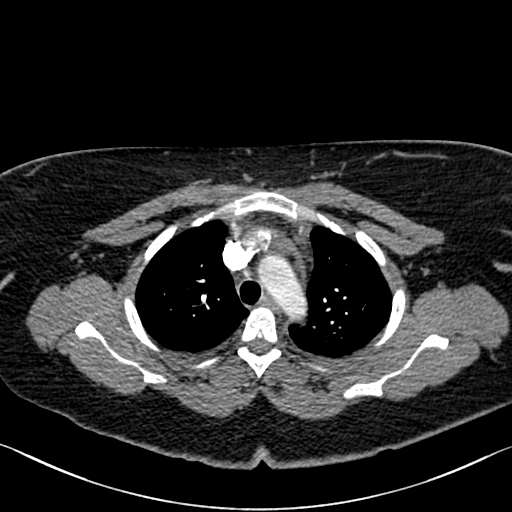
[im 285/353  lung]
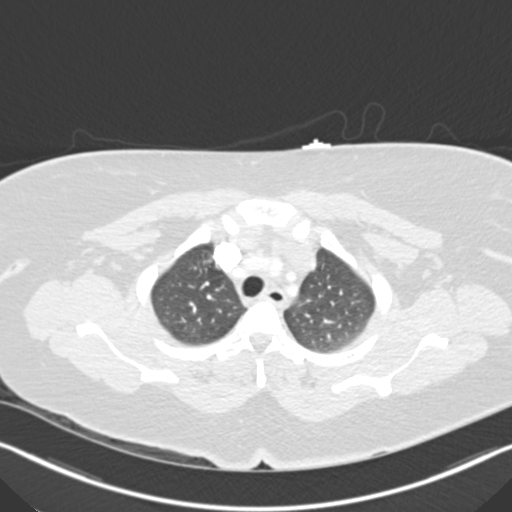
[im 302/353  mediastinal]
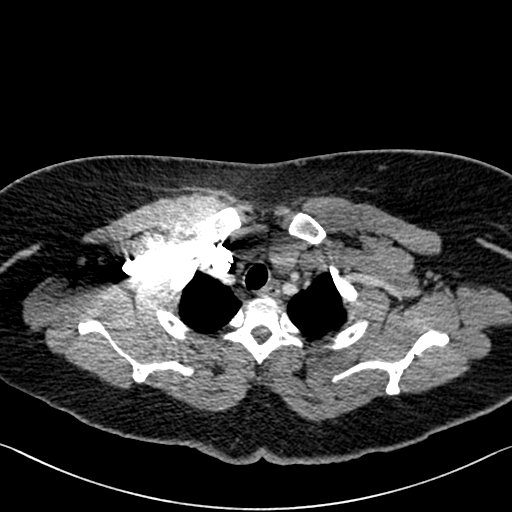
[im 319/353  lung]
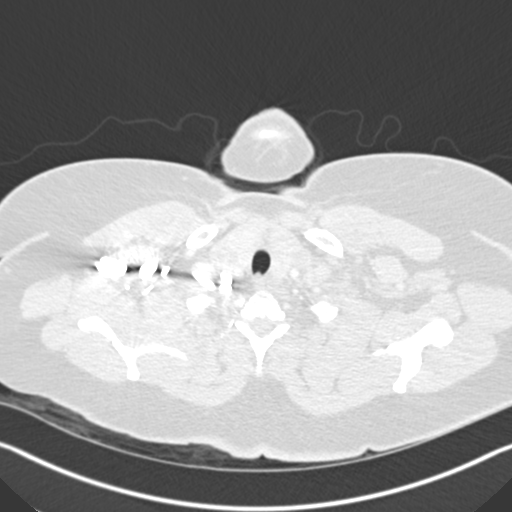
[im 336/353  mediastinal]
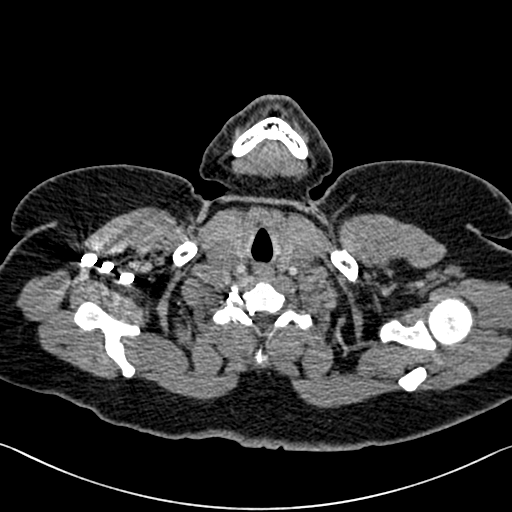

[19 of 36 positions shown; findings below may reference images not displayed]

FINDINGS: Cardiovascular: Satisfactory opacification of the pulmonary arteries
to the segmental level. No evidence of pulmonary embolism. Normal
heart size. No pericardial effusion.

Mediastinum/Nodes: No enlarged mediastinal, hilar, or axillary lymph
nodes. Thyroid gland, trachea, and esophagus demonstrate no
significant findings.

Lungs/Pleura: Small bilateral pleural effusions. Patchy airspace
opacities in the lung bases, atelectasis versus infectious
infiltrate. Airways are patent and normal in caliber.

Upper Abdomen: No acute abnormality.

Musculoskeletal: No chest wall abnormality. No acute or significant
osseous findings.

Review of the MIP images confirms the above findings.
IMPRESSION: 1. Negative for pulmonary embolism or other acute vascular event.
2. Small pleural effusions bilaterally, as well as patchy basilar
airspace opacities. Leading considerations are pneumonia versus
atelectasis.

## 2018-04-25 IMAGING — DX DG ABDOMEN ACUTE W/ 1V CHEST
3 series · 3 of 3 positions shown · non-contrast
Comparison: Chest radiograph January 30, 2016

CLINICAL DATA: Lower abdominal pain, shortness of breath and
headache.

EXAM:
DG ABDOMEN ACUTE W/ 1V CHEST

[chest pa]
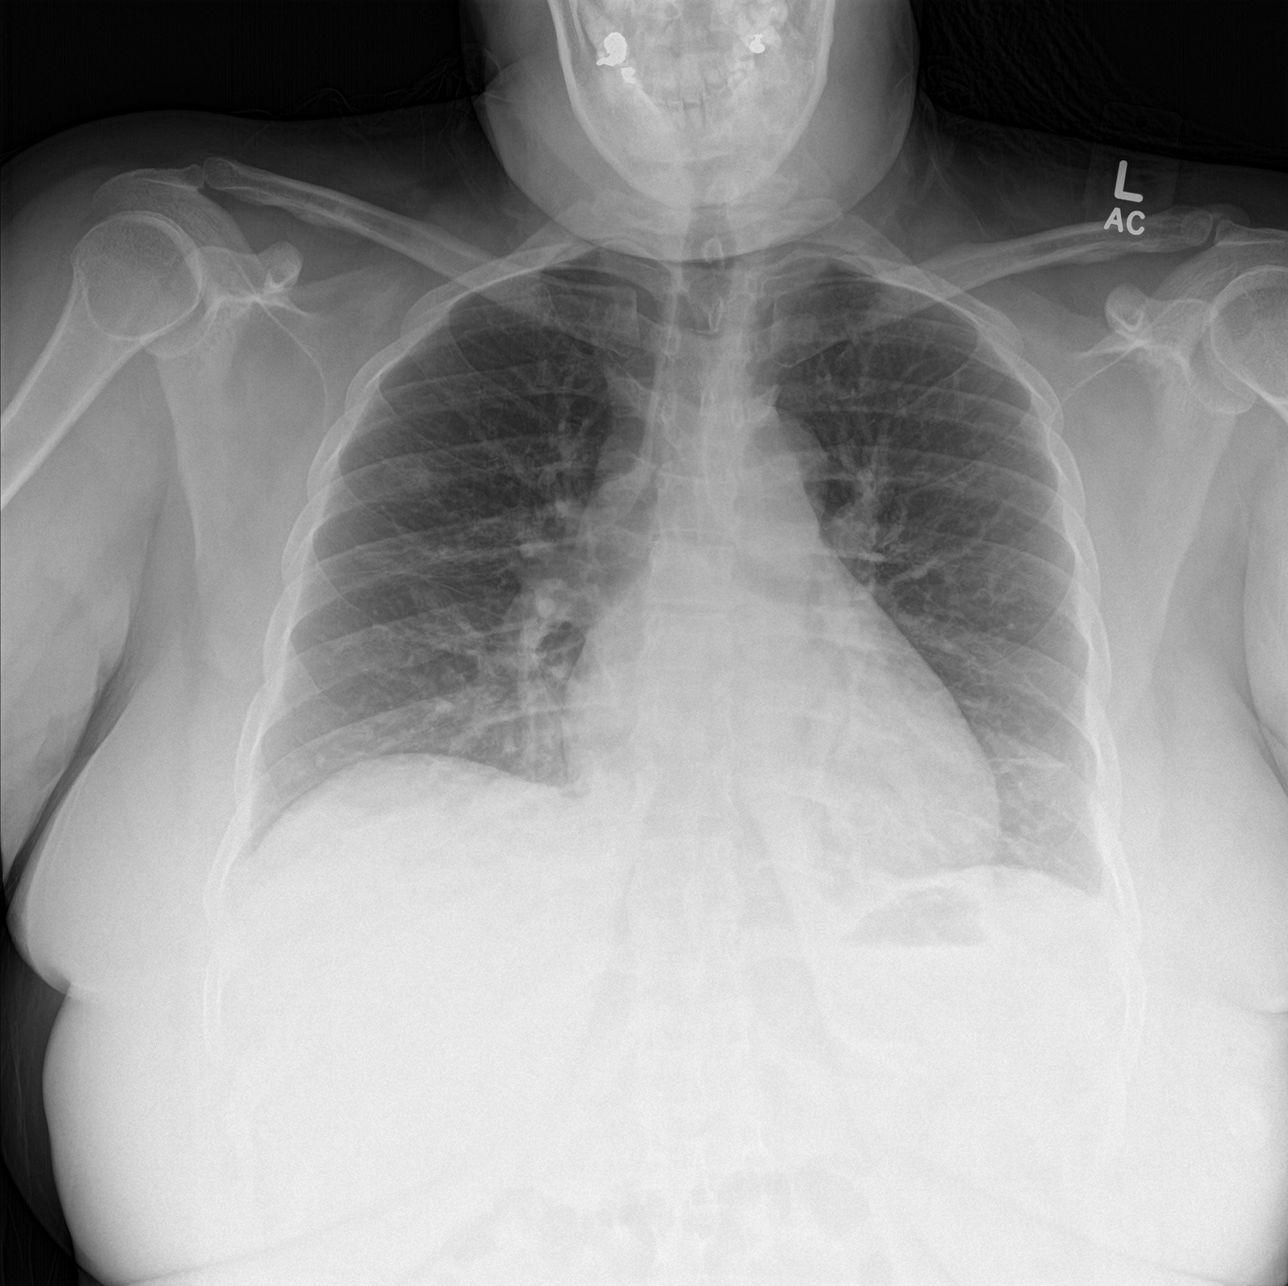

[abdomen erect]
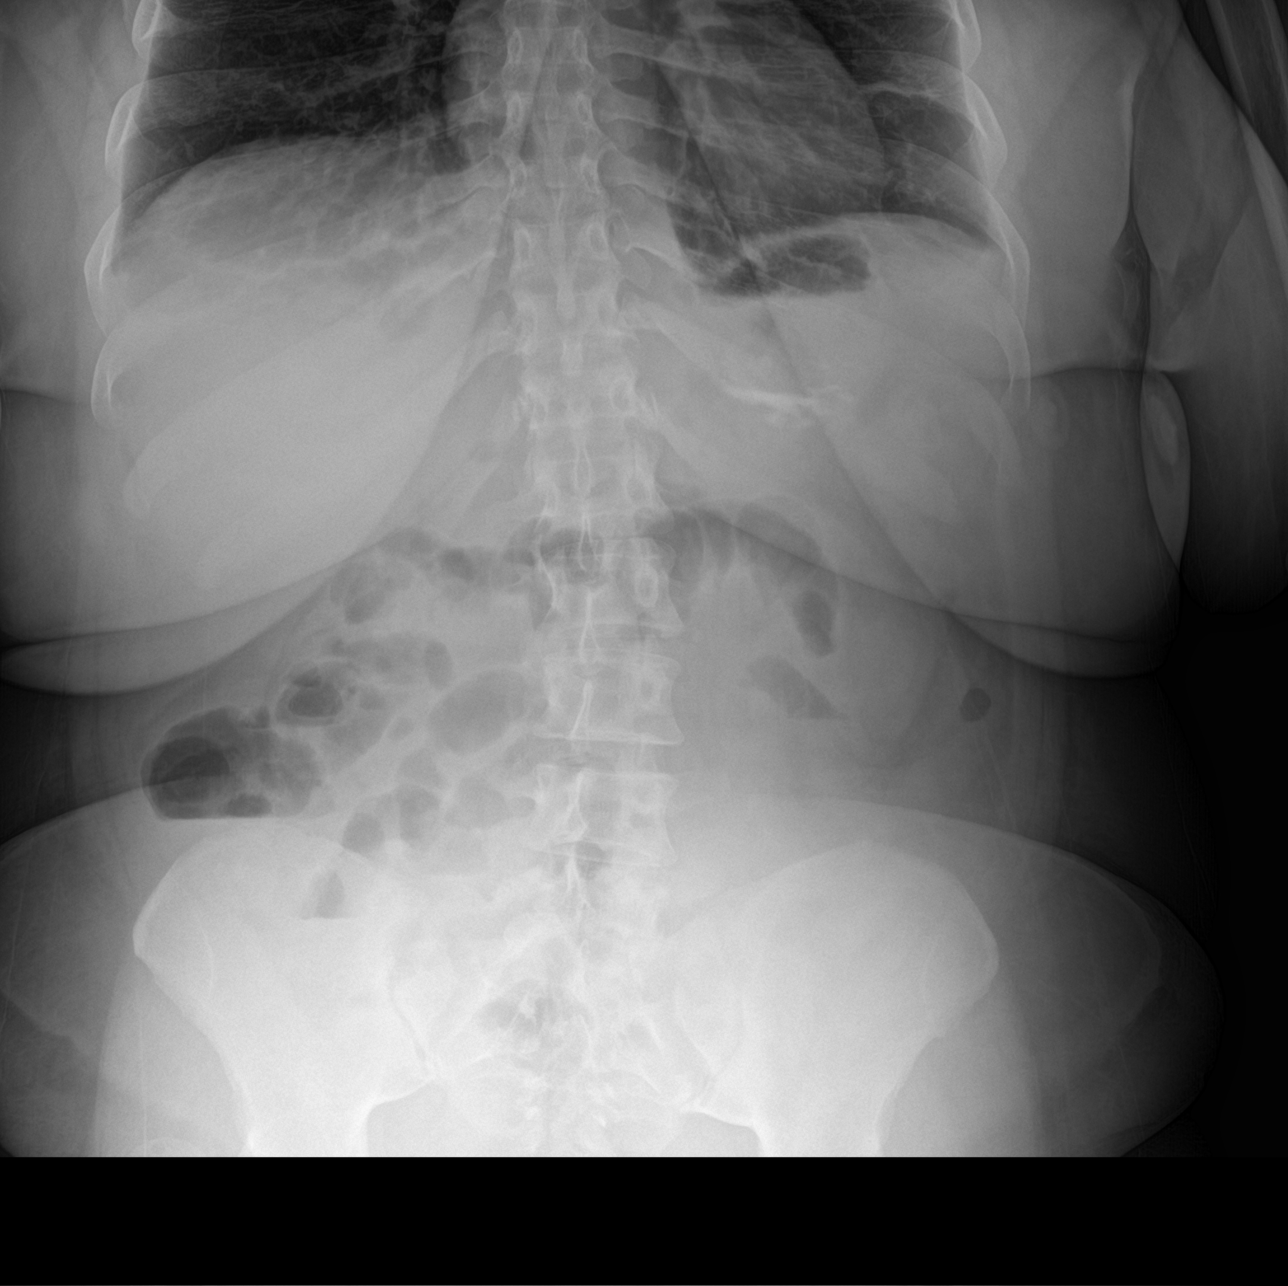

[abdomen supine]
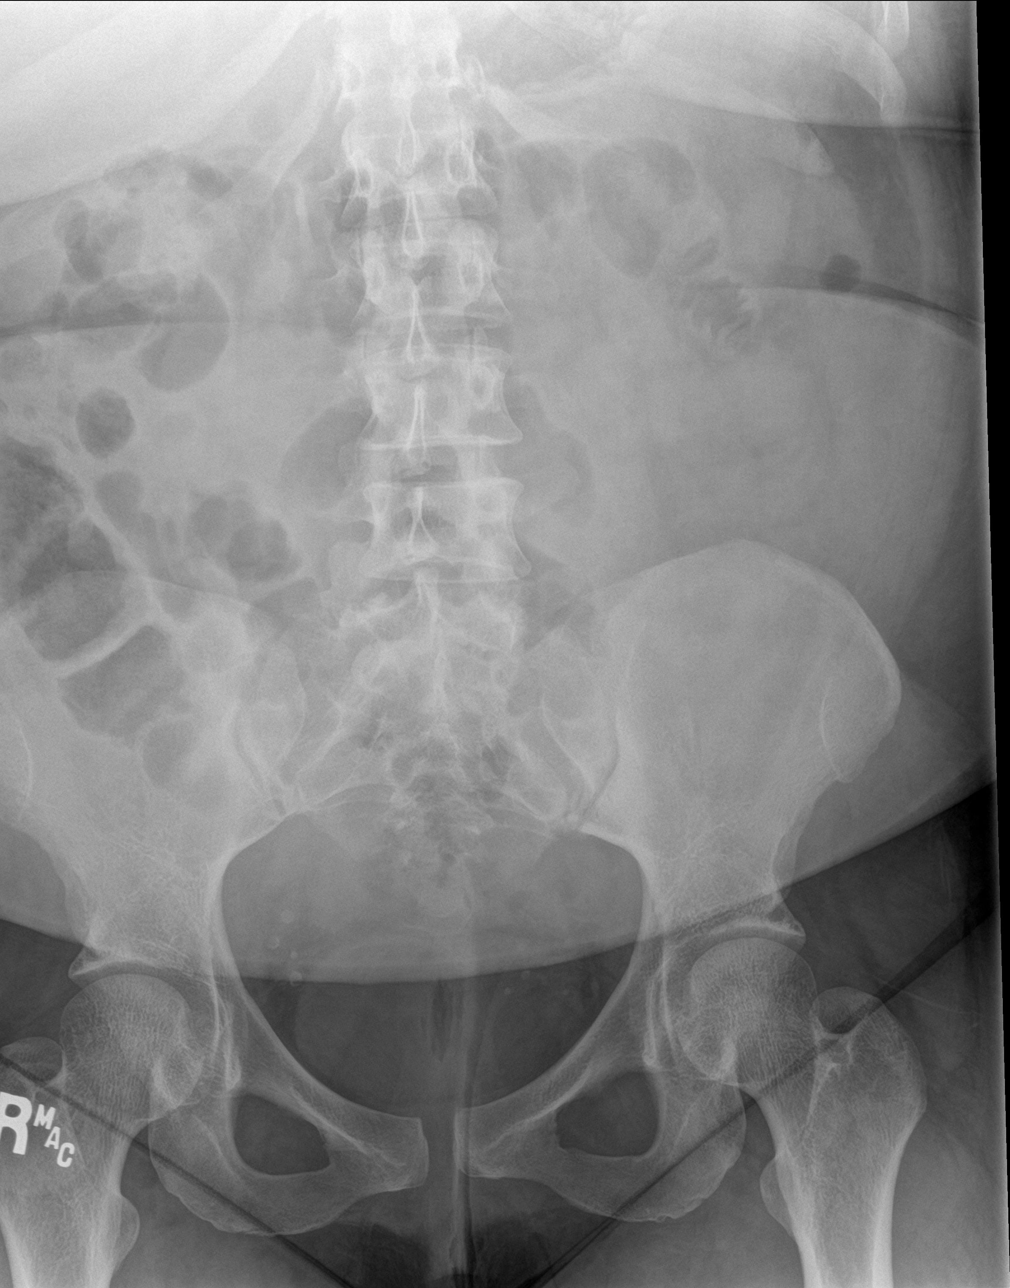

[3 of 3 positions shown; findings below may reference images not displayed]

FINDINGS: Cardiomediastinal silhouette is normal. Bandlike density LEFT lung
base. Lungs are otherwise clear, no pleural effusions. No
pneumothorax. Soft tissue planes and included osseous structures are
unremarkable.

Bowel gas pattern is nondilated and nonobstructive. Contrast in the
stomach. Phleboliths project in the pelvis. No intra-abdominal mass
effect, pathologic calcifications or free air. Soft tissue planes
and included osseous structures are non-suspicious.
IMPRESSION: LEFT lung base atelectasis.

Normal bowel gas pattern.

## 2018-05-05 DIAGNOSIS — K219 Gastro-esophageal reflux disease without esophagitis: Secondary | ICD-10-CM | POA: Diagnosis not present

## 2018-05-05 DIAGNOSIS — F3341 Major depressive disorder, recurrent, in partial remission: Secondary | ICD-10-CM | POA: Diagnosis not present

## 2018-05-05 DIAGNOSIS — Z720 Tobacco use: Secondary | ICD-10-CM | POA: Diagnosis not present

## 2018-05-05 DIAGNOSIS — Z113 Encounter for screening for infections with a predominantly sexual mode of transmission: Secondary | ICD-10-CM | POA: Diagnosis not present

## 2018-05-05 DIAGNOSIS — J45909 Unspecified asthma, uncomplicated: Secondary | ICD-10-CM | POA: Diagnosis not present

## 2018-05-05 DIAGNOSIS — Z0000 Encounter for general adult medical examination without abnormal findings: Secondary | ICD-10-CM | POA: Diagnosis not present

## 2018-05-05 DIAGNOSIS — J302 Other seasonal allergic rhinitis: Secondary | ICD-10-CM | POA: Diagnosis not present

## 2018-05-05 DIAGNOSIS — E559 Vitamin D deficiency, unspecified: Secondary | ICD-10-CM | POA: Diagnosis not present

## 2018-05-05 DIAGNOSIS — E669 Obesity, unspecified: Secondary | ICD-10-CM | POA: Diagnosis not present

## 2018-05-05 DIAGNOSIS — E785 Hyperlipidemia, unspecified: Secondary | ICD-10-CM | POA: Diagnosis not present

## 2018-05-05 DIAGNOSIS — N926 Irregular menstruation, unspecified: Secondary | ICD-10-CM | POA: Diagnosis not present

## 2018-05-24 DIAGNOSIS — D069 Carcinoma in situ of cervix, unspecified: Secondary | ICD-10-CM | POA: Diagnosis not present

## 2018-05-24 DIAGNOSIS — Z3202 Encounter for pregnancy test, result negative: Secondary | ICD-10-CM | POA: Diagnosis not present

## 2018-05-24 DIAGNOSIS — Z87410 Personal history of cervical dysplasia: Secondary | ICD-10-CM | POA: Diagnosis not present

## 2018-05-24 DIAGNOSIS — R87810 Cervical high risk human papillomavirus (HPV) DNA test positive: Secondary | ICD-10-CM | POA: Diagnosis not present

## 2018-05-24 DIAGNOSIS — Z202 Contact with and (suspected) exposure to infections with a predominantly sexual mode of transmission: Secondary | ICD-10-CM | POA: Diagnosis not present

## 2018-05-30 DIAGNOSIS — A5403 Gonococcal cervicitis, unspecified: Secondary | ICD-10-CM | POA: Diagnosis not present

## 2018-06-06 DIAGNOSIS — D069 Carcinoma in situ of cervix, unspecified: Secondary | ICD-10-CM | POA: Diagnosis not present

## 2018-09-01 DIAGNOSIS — Z3201 Encounter for pregnancy test, result positive: Secondary | ICD-10-CM | POA: Diagnosis not present

## 2018-09-01 DIAGNOSIS — Z3041 Encounter for surveillance of contraceptive pills: Secondary | ICD-10-CM | POA: Diagnosis not present

## 2018-09-02 DIAGNOSIS — Z111 Encounter for screening for respiratory tuberculosis: Secondary | ICD-10-CM | POA: Diagnosis not present

## 2018-09-06 DIAGNOSIS — Z3A00 Weeks of gestation of pregnancy not specified: Secondary | ICD-10-CM | POA: Diagnosis not present

## 2018-09-06 DIAGNOSIS — O219 Vomiting of pregnancy, unspecified: Secondary | ICD-10-CM | POA: Diagnosis not present

## 2018-09-06 DIAGNOSIS — Z3201 Encounter for pregnancy test, result positive: Secondary | ICD-10-CM | POA: Diagnosis not present

## 2018-09-26 DIAGNOSIS — O26891 Other specified pregnancy related conditions, first trimester: Secondary | ICD-10-CM | POA: Diagnosis not present

## 2018-09-26 DIAGNOSIS — Z3A09 9 weeks gestation of pregnancy: Secondary | ICD-10-CM | POA: Diagnosis not present

## 2018-09-26 DIAGNOSIS — R102 Pelvic and perineal pain: Secondary | ICD-10-CM | POA: Diagnosis not present

## 2018-10-03 DIAGNOSIS — Z01411 Encounter for gynecological examination (general) (routine) with abnormal findings: Secondary | ICD-10-CM | POA: Diagnosis not present

## 2018-10-03 DIAGNOSIS — Z3A10 10 weeks gestation of pregnancy: Secondary | ICD-10-CM | POA: Diagnosis not present

## 2018-10-03 DIAGNOSIS — Z3689 Encounter for other specified antenatal screening: Secondary | ICD-10-CM | POA: Diagnosis not present

## 2018-10-03 DIAGNOSIS — O3680X0 Pregnancy with inconclusive fetal viability, not applicable or unspecified: Secondary | ICD-10-CM | POA: Diagnosis not present

## 2018-10-03 DIAGNOSIS — R87613 High grade squamous intraepithelial lesion on cytologic smear of cervix (HGSIL): Secondary | ICD-10-CM | POA: Diagnosis not present

## 2018-10-03 DIAGNOSIS — O9989 Other specified diseases and conditions complicating pregnancy, childbirth and the puerperium: Secondary | ICD-10-CM | POA: Diagnosis not present

## 2018-10-21 DIAGNOSIS — Z3682 Encounter for antenatal screening for nuchal translucency: Secondary | ICD-10-CM | POA: Diagnosis not present

## 2018-10-21 DIAGNOSIS — Z3A12 12 weeks gestation of pregnancy: Secondary | ICD-10-CM | POA: Diagnosis not present

## 2018-10-21 DIAGNOSIS — Z369 Encounter for antenatal screening, unspecified: Secondary | ICD-10-CM | POA: Diagnosis not present

## 2018-11-08 DIAGNOSIS — R1084 Generalized abdominal pain: Secondary | ICD-10-CM | POA: Diagnosis not present

## 2018-11-08 DIAGNOSIS — O26892 Other specified pregnancy related conditions, second trimester: Secondary | ICD-10-CM | POA: Diagnosis not present

## 2018-11-08 DIAGNOSIS — R110 Nausea: Secondary | ICD-10-CM | POA: Diagnosis not present

## 2018-11-08 DIAGNOSIS — Z3A17 17 weeks gestation of pregnancy: Secondary | ICD-10-CM | POA: Diagnosis not present

## 2018-11-08 DIAGNOSIS — O219 Vomiting of pregnancy, unspecified: Secondary | ICD-10-CM | POA: Diagnosis not present

## 2018-11-08 DIAGNOSIS — R51 Headache: Secondary | ICD-10-CM | POA: Diagnosis not present

## 2018-11-30 DIAGNOSIS — Z3A18 18 weeks gestation of pregnancy: Secondary | ICD-10-CM | POA: Diagnosis not present

## 2018-11-30 DIAGNOSIS — Z3689 Encounter for other specified antenatal screening: Secondary | ICD-10-CM | POA: Diagnosis not present

## 2018-12-09 ENCOUNTER — Encounter (HOSPITAL_BASED_OUTPATIENT_CLINIC_OR_DEPARTMENT_OTHER): Payer: Self-pay

## 2018-12-09 ENCOUNTER — Other Ambulatory Visit: Payer: Self-pay

## 2018-12-09 ENCOUNTER — Emergency Department (HOSPITAL_BASED_OUTPATIENT_CLINIC_OR_DEPARTMENT_OTHER)
Admission: EM | Admit: 2018-12-09 | Discharge: 2018-12-09 | Disposition: A | Discharge status: Home / Self Care | Payer: Medicare Other | Attending: Emergency Medicine | Admitting: Emergency Medicine

## 2018-12-09 DIAGNOSIS — J45909 Unspecified asthma, uncomplicated: Secondary | ICD-10-CM | POA: Diagnosis not present

## 2018-12-09 DIAGNOSIS — J069 Acute upper respiratory infection, unspecified: Secondary | ICD-10-CM | POA: Insufficient documentation

## 2018-12-09 DIAGNOSIS — Z87891 Personal history of nicotine dependence: Secondary | ICD-10-CM | POA: Insufficient documentation

## 2018-12-09 DIAGNOSIS — B9789 Other viral agents as the cause of diseases classified elsewhere: Secondary | ICD-10-CM | POA: Diagnosis not present

## 2018-12-09 DIAGNOSIS — Z3A22 22 weeks gestation of pregnancy: Secondary | ICD-10-CM | POA: Diagnosis not present

## 2018-12-09 DIAGNOSIS — O99512 Diseases of the respiratory system complicating pregnancy, second trimester: Secondary | ICD-10-CM | POA: Diagnosis not present

## 2018-12-09 DIAGNOSIS — Z79899 Other long term (current) drug therapy: Secondary | ICD-10-CM | POA: Insufficient documentation

## 2018-12-09 DIAGNOSIS — R05 Cough: Secondary | ICD-10-CM | POA: Diagnosis not present

## 2018-12-09 NOTE — ED Provider Notes (Signed)
[facility] Provider Note   CSN: [id number] Arrival date & time: 12/09/18  1944     History   Chief Complaint Chief Complaint  Patient presents with  . Cough    [redacted] weeks pregnant    HPI Michelle Giles is a 32 y.o. female.  Patient is a 32 year old female who presents with a cough.  She is [redacted] weeks pregnant.  She has had cough and URI symptoms for about a week.  She has a persistent cough which is mostly dry but occasionally will bring up some sputum.  No known fevers.  No chest pain.  Occasionally she gets short of breath during coughing spells.  She has had a little bit of rhinorrhea and nasal congestion and has mucus in the back of her throat.     Past Medical History:  Diagnosis Date  . Asthma     There are no active problems to display for this patient.   History reviewed. No pertinent surgical history.   OB History    Gravida  1   Para      Term      Preterm      AB      Living        SAB      TAB      Ectopic      Multiple      Live Births               Home Medications    Prior to Admission medications   Medication Sig Start Date End Date Taking? Authorizing Provider  ALBUTEROL IN Inhale into the lungs.    [provider]  omeprazole (PRILOSEC) 20 MG capsule Take 1 capsule (20 mg total) by mouth daily. 11/24/16   Horton, Barbette Hair, MD  ondansetron (ZOFRAN) 4 MG tablet Take 1 tablet (4 mg total) by mouth every 6 (six) hours as needed for nausea or vomiting. 88/41/66   Delora Fuel, MD  potassium chloride 20 MEQ TBCR Take 20 mEq by mouth daily. 11/24/16   Horton, Barbette Hair, MD  valACYclovir (VALTREX) 1000 MG tablet Take 1 tablet (1,000 mg total) by mouth 3 (three) times daily. 06/20/15   Delora Fuel, MD    Family History No family history on file.  Social History Social History   Tobacco Use  . Smoking status: Former Smoker    Types: Cigarettes  . Smokeless tobacco: Never Used  .  Tobacco comment: preg  Substance Use Topics  . Alcohol use: Not Currently    Comment: preg  . Drug use: No     Allergies   Penicillins   Review of Systems Review of Systems  Constitutional: Positive for fatigue. Negative for chills, diaphoresis and fever.  HENT: Positive for congestion and postnasal drip. Negative for rhinorrhea and sneezing.   Eyes: Negative.   Respiratory: Positive for cough and shortness of breath (Mostly during coughing spells). Negative for chest tightness.   Cardiovascular: Negative for chest pain and leg swelling.  Gastrointestinal: Negative for abdominal pain, blood in stool, diarrhea, nausea and vomiting.  Genitourinary: Negative for difficulty urinating, flank pain, frequency and hematuria.  Musculoskeletal: Negative for arthralgias and back pain.  Skin: Negative for rash.  Neurological: Negative for dizziness, speech difficulty, weakness, numbness and headaches.     Physical Exam Updated Vital Signs BP 112/78 (BP Location: Left Arm)   Pulse (!) 103   Temp 98.1 F (36.7 C) (Oral)   Resp 18  Wt 101.8 kg   SpO2 100%   BMI 41.05 kg/m   Physical Exam Constitutional:      Appearance: She is well-developed.  HENT:     Head: Normocephalic and atraumatic.     Right Ear: Tympanic membrane normal.     Left Ear: Tympanic membrane normal.     Nose: Congestion present.     Mouth/Throat:     Pharynx: No oropharyngeal exudate or posterior oropharyngeal erythema.  Eyes:     Pupils: Pupils are equal, round, and reactive to light.  Neck:     Musculoskeletal: Normal range of motion and neck supple.  Cardiovascular:     Rate and Rhythm: Normal rate and regular rhythm.     Heart sounds: Normal heart sounds.  Pulmonary:     Effort: Pulmonary effort is normal. No respiratory distress.     Breath sounds: Normal breath sounds. No wheezing or rales.  Chest:     Chest wall: No tenderness.  Abdominal:     General: Bowel sounds are normal.     Palpations:  Abdomen is soft.     Tenderness: There is no abdominal tenderness. There is no guarding or rebound.     Comments: Gravid uterus above the umbilicus  Musculoskeletal: Normal range of motion.  Lymphadenopathy:     Cervical: No cervical adenopathy.  Skin:    General: Skin is warm and dry.     Findings: No rash.  Neurological:     Mental Status: She is alert and oriented to person, place, and time.      ED Treatments / Results  Labs (all labs ordered are listed, but only abnormal results are displayed) Labs Reviewed - No data to display  EKG None  Radiology No results found.  Procedures Procedures (including critical care time)  Medications Ordered in ED Medications - No data to display   Initial Impression / Assessment and Plan / ED Course  I have reviewed the triage vital signs and the nursing notes.  Pertinent labs & imaging results that were available during my care of the patient were reviewed by me and considered in my medical decision making (see chart for details).     Patient is a 32 year old female who presents with cough and URI symptoms.  Been going on for about a week.  She is afebrile.  No hypoxia.  Her lungs are clear without suggestions of pneumonia.  No increased work of breathing.  She had one episode of posttussive emesis in the ED but no other vomiting.  She was discharged home in good condition.  Fetal heart tones were 151.  She was advised to use Mucinex for symptomatic relief.  She was encouraged to follow-up with her OB/GYN on Monday if her symptoms are not improving or return here as needed for any worsening symptoms.  Final Clinical Impressions(s) / ED Diagnoses   Final diagnoses:  Viral URI with cough    ED Discharge Orders    None       Malvin Johns, MD 12/09/18 2213

## 2018-12-09 NOTE — ED Triage Notes (Signed)
C/o flu like sx x 1 week-pt is [redacted] weeks pregnant-NAD-steady gait

## 2018-12-21 NOTE — L&D Delivery Note (Signed)
 Delivery Note  PRE-OPERATIVE DIAGNOSIS:  1) [redacted]w[redacted]d pregnancy.  2) Labor 3) thin meconium  POST-OPERATIVE DIAGNOSIS:  1) [redacted]w[redacted]d pregnancy s/p Vaginal, Spontaneous   Delivery Type: Vaginal, Spontaneous   Delivery Note: s/p NSVD without complication. Placenta delivered intact with 3 vessels. Laceration repaired in usual fashion.   Delivery Clinician: TOBIE KAROLE SPENCE   Delivery Anesthesia: Epidural   Labor Complications:   none  Lacerations:   periureteral  ESTIMATED BLOOD LOSS: 200    FINDINGS:   1) female infant, Apgar scores of 9    at 1 minute and 9    at 5 minutes and a birthweight of    ounces.    SPECIMENS: - none    COMPLICATIONS: None; patient tolerated the procedure well.  DISPOSITION:  Infant with the mother   KAROLE GEANNIE TOBIE MD   Delivery Summary   OB History    Gravida  8   Para  5   Term  5   Preterm  0   AB  2   Living  5     SAB  0   TAB  2   Ectopic  0   Molar  0   Multiple  0   Live Births  5       # Outcome Date GA Labor/2nd Weight Sex Delivery Anes PTL Lv A1 A5   1 Term 06/05/02 [redacted]w[redacted]d  6 lb 9 oz (2.977 kg) F Vag-Spont Epidural  Living        2 Term 03/20/05 [redacted]w[redacted]d  6 lb 9 oz (2.977 kg) M Vag-Spont Epidural  Living        3 Term 05/24/07 [redacted]w[redacted]d  6 lb (2.722 kg) F Vag-Spont Epidural  Living        4 Term 01/11/09 [redacted]w[redacted]d  7 lb (3.175 kg) F Vag-Spont Epidural  Living        5 Term 12/01/11 [redacted]w[redacted]d  5 lb (2.268 kg) F Vag-Spont Epidural  Living        6 TAB 10/01/14 [redacted]w[redacted]d               7 TAB 05/03/17 [redacted]w[redacted]d               8 Current                     Hurrell, BG [[id number]]   Labor Events    Document a rupture identifier for this baby  Rupture Date/Time: 04/26/19  6:58 PM ReadOnly  Edit in flowsheets  Rupture type:  Artificial  Fluid color:  Thin Meconium   Induction/Augmentation  Induction Date/Time:   Now  Augmentation Date/Time:   Now  Cervical Ripening   Anesthesia   Method:  Epidural   Operative Delivery   No  data filed    Shoulder Dystocia   Shoulder dystocia present?:  No   NewBorn Information   Time head delivered:  04/26/2019 2111   Birth date/time:   04/26/[birth year] 2111   Delivery type:  Vaginal, Spontaneous  C-Section Details:       Delivery Providers   Delivering clinician:  Kalpen Navin Patel, MD  Other personnel:   Provider Role  Inocente FORBES Monday, RN Delivery Nurse  Suzen JAYSON Bring, RN Infant Care Provider      Cord   Vessels:  3 vessels Complications:  None   Gases sent?:  No  Number of Loops:  1 Delayed cord clamping?:  Yes Cord Around:  head   Placenta  Date/time:  04/26/2019 2113 Removal:  Spontaneous Appearance:  Intact Disposition:  Discarded   Resuscitation   Intervention:  None   Apgars   Living status:  Living    Skin color:  Heart rate:  Reflex irritability:  Muscle tone:  Respiratory effort:  Total:         1 Minute:    1   2   2   2   2   9       5  Minute:    1   2   2   2   2   9       10  Minute:            15 Minute:            20 Minute:               Measurements   Weight:   Length:     Delivery (Maternal)   Episiotomy:  None  Periurethral laceration:  left Repaired:  Yes   Vaginal delivery est. blood loss (mL):  200   Vaginal Counts     Sponges:   Sharps:    Initial Counts:      Final Counts:      Accurate final count?:  Yes Vaginal Packing Placed?:  No Uterine Packing Placed?:  No       Time of Arrival to Admission:  12 minutes    Electronically signed by: Kalpen Navin Patel, MD 04/26/2019 9:20 PM   Electronically signed by: Kalpen Navin Patel, MD 04/26/19 2120

## 2018-12-28 DIAGNOSIS — E669 Obesity, unspecified: Secondary | ICD-10-CM | POA: Diagnosis not present

## 2018-12-28 DIAGNOSIS — O99212 Obesity complicating pregnancy, second trimester: Secondary | ICD-10-CM | POA: Diagnosis not present

## 2018-12-28 DIAGNOSIS — Z362 Encounter for other antenatal screening follow-up: Secondary | ICD-10-CM | POA: Diagnosis not present

## 2018-12-28 DIAGNOSIS — Z3A22 22 weeks gestation of pregnancy: Secondary | ICD-10-CM | POA: Diagnosis not present

## 2019-02-15 DIAGNOSIS — Z3A29 29 weeks gestation of pregnancy: Secondary | ICD-10-CM | POA: Diagnosis not present

## 2019-02-15 DIAGNOSIS — Z3483 Encounter for supervision of other normal pregnancy, third trimester: Secondary | ICD-10-CM | POA: Diagnosis not present

## 2019-02-15 DIAGNOSIS — Z3689 Encounter for other specified antenatal screening: Secondary | ICD-10-CM | POA: Diagnosis not present

## 2019-03-24 DIAGNOSIS — Z3A34 34 weeks gestation of pregnancy: Secondary | ICD-10-CM | POA: Diagnosis not present

## 2019-03-24 DIAGNOSIS — Z3A35 35 weeks gestation of pregnancy: Secondary | ICD-10-CM | POA: Diagnosis not present

## 2019-03-24 DIAGNOSIS — O9989 Other specified diseases and conditions complicating pregnancy, childbirth and the puerperium: Secondary | ICD-10-CM | POA: Diagnosis not present

## 2019-03-24 DIAGNOSIS — O352XX0 Maternal care for (suspected) hereditary disease in fetus, not applicable or unspecified: Secondary | ICD-10-CM | POA: Diagnosis not present

## 2019-03-24 DIAGNOSIS — Z3689 Encounter for other specified antenatal screening: Secondary | ICD-10-CM | POA: Diagnosis not present

## 2019-03-24 DIAGNOSIS — N858 Other specified noninflammatory disorders of uterus: Secondary | ICD-10-CM | POA: Diagnosis not present

## 2019-04-05 DIAGNOSIS — Z3689 Encounter for other specified antenatal screening: Secondary | ICD-10-CM | POA: Diagnosis not present

## 2019-04-05 DIAGNOSIS — Z3A36 36 weeks gestation of pregnancy: Secondary | ICD-10-CM | POA: Diagnosis not present

## 2019-04-05 DIAGNOSIS — Z3483 Encounter for supervision of other normal pregnancy, third trimester: Secondary | ICD-10-CM | POA: Diagnosis not present

## 2019-04-15 DIAGNOSIS — Z3A37 37 weeks gestation of pregnancy: Secondary | ICD-10-CM | POA: Diagnosis not present

## 2019-04-26 DIAGNOSIS — Z3A39 39 weeks gestation of pregnancy: Secondary | ICD-10-CM | POA: Diagnosis not present

## 2019-04-26 DIAGNOSIS — R87613 High grade squamous intraepithelial lesion on cytologic smear of cervix (HGSIL): Secondary | ICD-10-CM | POA: Diagnosis present

## 2019-04-26 DIAGNOSIS — Z87891 Personal history of nicotine dependence: Secondary | ICD-10-CM | POA: Diagnosis not present

## 2019-04-26 DIAGNOSIS — D561 Beta thalassemia: Secondary | ICD-10-CM | POA: Diagnosis present

## 2019-04-26 DIAGNOSIS — O9912 Other diseases of the blood and blood-forming organs and certain disorders involving the immune mechanism complicating childbirth: Secondary | ICD-10-CM | POA: Diagnosis present

## 2019-04-26 DIAGNOSIS — J45909 Unspecified asthma, uncomplicated: Secondary | ICD-10-CM | POA: Diagnosis present

## 2019-04-26 DIAGNOSIS — Z1159 Encounter for screening for other viral diseases: Secondary | ICD-10-CM | POA: Diagnosis not present

## 2019-04-26 DIAGNOSIS — Z880 Allergy status to penicillin: Secondary | ICD-10-CM | POA: Diagnosis not present

## 2019-04-26 DIAGNOSIS — O9952 Diseases of the respiratory system complicating childbirth: Secondary | ICD-10-CM | POA: Diagnosis present

## 2019-04-26 NOTE — H&P (Addendum)
 CC: Pain, Pressure, Contractions  Subjective:   HPI: Michelle Giles is a 33 y.o. 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) - has not had done yet 11/30/18 GBS negative MCD tubal consent signed 04-05-19       OB History  Gravida Para Term Preterm AB Living  8 5 5  0 2 5  SAB TAB Ectopic Molar Multiple Live Births  0 2 0 0 0 5    # Outcome Date GA Lbr Len/2nd Weight Sex Delivery Anes PTL Lv  8 Current           7 TAB 05/03/17 [redacted]w[redacted]d         6 TAB 10/01/14 [redacted]w[redacted]d         5 Term 12/01/11 [redacted]w[redacted]d  5 lb (2.268 kg) F Vag-Spont EPI  LIV  4 Term 01/11/09 [redacted]w[redacted]d  7 lb (3.175 kg) F Vag-Spont EPI  LIV  3 Term 05/24/07 [redacted]w[redacted]d  6 lb (2.722 kg) F Vag-Spont EPI  LIV  2 Term 03/20/05 [redacted]w[redacted]d  6 lb 9 oz (2.977 kg) M Vag-Spont EPI  LIV  1 Term 06/05/02 [redacted]w[redacted]d  6 lb 9 oz (2.977 kg) F Vag-Spont EPI  LIV     Past Surgical History:  Procedure Laterality Date  . COLPOSCOPY      Allergies  Allergen Reactions  . Penicillins Itching / Pruritis (ALLERGY/intolerance)    Past Medical History:  Diagnosis Date  . Abnormal Pap smear of cervix   . Anemia   . Asthma   . Mental disorder     Family History  Problem Relation Age of Onset  . Hypertension Mother   . Heart disease Mother   . Hypertension Father   . Diabetes Father   . Heart disease Father   . Hypertension Maternal Grandmother   . Diabetes Maternal Grandmother   . Heart disease Maternal Grandmother     Social History   Socioeconomic History  . Marital status: Single    Spouse name: Not on file  . Number of children: Not on file  . Years of education: Not on file  . Highest  education level: Not on file  Occupational History  . Not on file  Social Needs  . Financial resource strain: Not on file  . Food insecurity    Worry: Not on file    Inability: Not on file  . Transportation needs    Medical: Not on file    Non-medical: Not on file  Tobacco Use  . Smoking status: Former Smoker    Packs/day: 0.25    Types: Cigarettes    Last attempt to quit: 08/2018    Years since quitting: 0.6  . Smokeless tobacco: Never Used  Substance and Sexual Activity  . Alcohol use: Not Currently    Comment: one month ago  . Drug use: No  . Sexual activity: Not on file  Lifestyle  . Physical activity    Days per week: Not on file    Minutes per session: Not on file  . Stress: Not on file  Relationships  . Social connections    Talks  on phone: Not on file    Gets together: Not on file    Attends religious service: Not on file    Active member of club or organization: Not on file    Attends meetings of clubs or organizations: Not on file    Relationship status: Not on file  Other Topics Concern  . Not on file  Social History Narrative  . Not on file     Current Facility-Administered Medications:  .  butorphanol (STADOL) injection 1 mg, 1 mg, Intravenous, Q2H PRN, Kalpen Navin Patel, MD .  lactated ringers infusion, , Intravenous, Continuous, Kalpen Navin Patel, MD .  ondansetron  (ZOFRAN ) injection 4 mg, 4 mg, Intravenous, Q6H PRN, Kalpen Navin Patel, MD .  ondansetron  (ZOFRAN -ODT) disintegrating tablet 8 mg, 8 mg, Oral, Q8H PRN, Kalpen Navin Patel, MD   Review Of System:  General: no fever, fatigue, weight or appetite changes Skin: no rashes, lesions, itching, mole change HEENT: no frequent headaches, hearing changes, sore throat, dental problems, sinus problems   Lymph: no tender or swollen lymph nodes  Breasts: no lumps, nipple discharge, breast pain Resp: no SOB, cough, wheezing Cardio: no chest pain, palpitations, edema GI: no N/V/D, constipation,  anorexia, bloating, reflux, blood in stool, leakage of stool  GU: no frequency, dysuria, flank pain, hematuria, incontinence, retention       GYN: no change in discharge or odor, itching, inter-menstrual or post-coital bleeding, dyspareunia  Musk: no weakness, arthralgia, joint swelling, ROM limitations Endocrine: no polydypsia, polyphagia, polyuria, heat/cold intolerance, hot flashes, dry skin Psych: no depression, anxiety, panic attacks  RECENT LABS: Results for orders placed or performed during the hospital encounter of 04/14/19  Wet Preparation, Clinic  Result Value Ref Range   WBC Many (A) Negative, Rare   Clue Cells Negative Negative   Yeast Positive (A) Negative   Trichomonas Negative Negative     Objective:   PHYSICAL EXAM: Breastfeeding Yes  Constitutional: Well-developed, well-nourished female in no acute distress Neurological: Alert and oriented to person, place, and time, affect appropriate Psychiatric: Mood and affect appropriate Skin: No rashes or lesions, No suspicious lesions, masses, or ulcers.  Neck: Supple without masses. Trachea is midline.Thyroid  is normal size without masses, carotid pulses normal and no bruits Lymphatics: No cervical, axillary, supraclavicular, or inguinal adenopathy noted Respiratory: Clear to auscultation bilaterally. Good air movement with normal work of  breathing. Cardiovascular: Regular rate and rhythm. Extremities grossly normal, nontender with no edema; pulses regular Gastrointestinal: Soft, nontender, nondistended. No masses or hernias appreciated. No hepatosplenomegaly. No fluid wave. No rebound or guarding. Breast Exam: Appear normal and symmetric without palpable masses, skin changes or nipple inversion.  No discharge, rash, or skin retraction. No palpable lymph nodes. No tenderness, masses, or nipple abnormality Genitourinary:         External Genitalia: Normal female genitalia w/o masses, lesions, rashes    Urethral Meatus:  Normal caliber and position    Urethra: Midline, no masses    Bladder: Well-suspended, NT    Vagina: mucosa pink and moist without lesions or ulcers     Cervix: No lesions, normal size and consistency; no cervical motion tenderness     Uterus: Gravid, NT Adnexa/Parametria: No masses; no parametrial nodularity; no tenderness Perineum/Anus: No lesions, no hemorids, normal specter tone   Chaperone present during exam.  SVE-4/80-1  Assessment/Plan:  Michelle Giles is a 33 y.o. female [id number] [redacted]w[redacted]d Pregnancy in labor  1. Admit to L and D 2. Pitocin if needed 3. Epidural when patient  desires 4. GBS treatment if positive \ 5. Patient's DVT risk was assessed and current plan will be to do knee-high compression stockings in on L and D  and postpartum   LOS: Ante OBS   Electronically signed by: Kalpen Navin Patel, MD 04/26/19 [phone]    Electronically signed by: Kalpen Navin Patel, MD 04/26/19 1859

## 2019-05-12 DIAGNOSIS — O9089 Other complications of the puerperium, not elsewhere classified: Secondary | ICD-10-CM | POA: Diagnosis not present

## 2019-05-12 DIAGNOSIS — M79605 Pain in left leg: Secondary | ICD-10-CM | POA: Diagnosis not present

## 2019-05-12 DIAGNOSIS — G44209 Tension-type headache, unspecified, not intractable: Secondary | ICD-10-CM | POA: Diagnosis not present

## 2019-06-13 DIAGNOSIS — R87613 High grade squamous intraepithelial lesion on cytologic smear of cervix (HGSIL): Secondary | ICD-10-CM | POA: Diagnosis not present

## 2019-06-30 DIAGNOSIS — N870 Mild cervical dysplasia: Secondary | ICD-10-CM | POA: Diagnosis not present

## 2019-06-30 DIAGNOSIS — Z3202 Encounter for pregnancy test, result negative: Secondary | ICD-10-CM | POA: Diagnosis not present

## 2019-06-30 DIAGNOSIS — N888 Other specified noninflammatory disorders of cervix uteri: Secondary | ICD-10-CM | POA: Diagnosis not present

## 2019-06-30 DIAGNOSIS — R87613 High grade squamous intraepithelial lesion on cytologic smear of cervix (HGSIL): Secondary | ICD-10-CM | POA: Diagnosis not present

## 2019-08-14 DIAGNOSIS — R001 Bradycardia, unspecified: Secondary | ICD-10-CM | POA: Diagnosis not present

## 2019-08-14 DIAGNOSIS — R002 Palpitations: Secondary | ICD-10-CM | POA: Diagnosis not present

## 2019-10-25 DIAGNOSIS — F321 Major depressive disorder, single episode, moderate: Secondary | ICD-10-CM | POA: Diagnosis not present

## 2019-11-01 DIAGNOSIS — F321 Major depressive disorder, single episode, moderate: Secondary | ICD-10-CM | POA: Diagnosis not present

## 2019-11-08 DIAGNOSIS — F321 Major depressive disorder, single episode, moderate: Secondary | ICD-10-CM | POA: Diagnosis not present

## 2019-11-22 DIAGNOSIS — F321 Major depressive disorder, single episode, moderate: Secondary | ICD-10-CM | POA: Diagnosis not present

## 2019-11-29 DIAGNOSIS — F321 Major depressive disorder, single episode, moderate: Secondary | ICD-10-CM | POA: Diagnosis not present

## 2019-12-07 DIAGNOSIS — F321 Major depressive disorder, single episode, moderate: Secondary | ICD-10-CM | POA: Diagnosis not present

## 2019-12-12 DIAGNOSIS — F321 Major depressive disorder, single episode, moderate: Secondary | ICD-10-CM | POA: Diagnosis not present

## 2019-12-20 DIAGNOSIS — F321 Major depressive disorder, single episode, moderate: Secondary | ICD-10-CM | POA: Diagnosis not present

## 2019-12-20 DIAGNOSIS — R87610 Atypical squamous cells of undetermined significance on cytologic smear of cervix (ASC-US): Secondary | ICD-10-CM | POA: Diagnosis not present

## 2019-12-20 DIAGNOSIS — Z202 Contact with and (suspected) exposure to infections with a predominantly sexual mode of transmission: Secondary | ICD-10-CM | POA: Diagnosis not present

## 2019-12-20 DIAGNOSIS — Z124 Encounter for screening for malignant neoplasm of cervix: Secondary | ICD-10-CM | POA: Diagnosis not present

## 2019-12-20 DIAGNOSIS — Z8742 Personal history of other diseases of the female genital tract: Secondary | ICD-10-CM | POA: Diagnosis not present

## 2019-12-20 DIAGNOSIS — Z1151 Encounter for screening for human papillomavirus (HPV): Secondary | ICD-10-CM | POA: Diagnosis not present

## 2020-01-24 DIAGNOSIS — F321 Major depressive disorder, single episode, moderate: Secondary | ICD-10-CM | POA: Diagnosis not present

## 2020-01-31 DIAGNOSIS — F321 Major depressive disorder, single episode, moderate: Secondary | ICD-10-CM | POA: Diagnosis not present

## 2020-02-04 DIAGNOSIS — X58XXXA Exposure to other specified factors, initial encounter: Secondary | ICD-10-CM | POA: Diagnosis not present

## 2020-02-04 DIAGNOSIS — Y999 Unspecified external cause status: Secondary | ICD-10-CM | POA: Diagnosis not present

## 2020-02-04 DIAGNOSIS — H10502 Unspecified blepharoconjunctivitis, left eye: Secondary | ICD-10-CM | POA: Diagnosis not present

## 2020-02-04 DIAGNOSIS — S0502XA Injury of conjunctiva and corneal abrasion without foreign body, left eye, initial encounter: Secondary | ICD-10-CM | POA: Diagnosis not present

## 2020-02-06 DIAGNOSIS — S0502XA Injury of conjunctiva and corneal abrasion without foreign body, left eye, initial encounter: Secondary | ICD-10-CM | POA: Diagnosis not present

## 2020-02-06 DIAGNOSIS — H5089 Other specified strabismus: Secondary | ICD-10-CM | POA: Diagnosis not present

## 2020-02-07 DIAGNOSIS — F321 Major depressive disorder, single episode, moderate: Secondary | ICD-10-CM | POA: Diagnosis not present

## 2020-02-13 DIAGNOSIS — F321 Major depressive disorder, single episode, moderate: Secondary | ICD-10-CM | POA: Diagnosis not present

## 2020-03-06 DIAGNOSIS — F321 Major depressive disorder, single episode, moderate: Secondary | ICD-10-CM | POA: Diagnosis not present

## 2020-03-12 DIAGNOSIS — Z131 Encounter for screening for diabetes mellitus: Secondary | ICD-10-CM | POA: Diagnosis not present

## 2020-03-12 DIAGNOSIS — Z0110 Encounter for examination of ears and hearing without abnormal findings: Secondary | ICD-10-CM | POA: Diagnosis not present

## 2020-03-12 DIAGNOSIS — Z136 Encounter for screening for cardiovascular disorders: Secondary | ICD-10-CM | POA: Diagnosis not present

## 2020-03-12 DIAGNOSIS — E785 Hyperlipidemia, unspecified: Secondary | ICD-10-CM | POA: Diagnosis not present

## 2020-03-12 DIAGNOSIS — Z3201 Encounter for pregnancy test, result positive: Secondary | ICD-10-CM | POA: Diagnosis not present

## 2020-03-12 DIAGNOSIS — Z113 Encounter for screening for infections with a predominantly sexual mode of transmission: Secondary | ICD-10-CM | POA: Diagnosis not present

## 2020-03-12 DIAGNOSIS — J45909 Unspecified asthma, uncomplicated: Secondary | ICD-10-CM | POA: Diagnosis not present

## 2020-03-12 DIAGNOSIS — J302 Other seasonal allergic rhinitis: Secondary | ICD-10-CM | POA: Diagnosis not present

## 2020-03-12 DIAGNOSIS — Z720 Tobacco use: Secondary | ICD-10-CM | POA: Diagnosis not present

## 2020-03-12 DIAGNOSIS — K219 Gastro-esophageal reflux disease without esophagitis: Secondary | ICD-10-CM | POA: Diagnosis not present

## 2020-03-12 DIAGNOSIS — F3341 Major depressive disorder, recurrent, in partial remission: Secondary | ICD-10-CM | POA: Diagnosis not present

## 2020-03-19 DIAGNOSIS — Z331 Pregnant state, incidental: Secondary | ICD-10-CM | POA: Diagnosis not present

## 2020-03-19 DIAGNOSIS — N898 Other specified noninflammatory disorders of vagina: Secondary | ICD-10-CM | POA: Diagnosis not present

## 2020-03-19 DIAGNOSIS — B373 Candidiasis of vulva and vagina: Secondary | ICD-10-CM | POA: Diagnosis not present

## 2020-03-20 DIAGNOSIS — F321 Major depressive disorder, single episode, moderate: Secondary | ICD-10-CM | POA: Diagnosis not present

## 2020-03-27 DIAGNOSIS — F321 Major depressive disorder, single episode, moderate: Secondary | ICD-10-CM | POA: Diagnosis not present

## 2020-05-27 DIAGNOSIS — Z124 Encounter for screening for malignant neoplasm of cervix: Secondary | ICD-10-CM | POA: Diagnosis not present

## 2020-05-27 DIAGNOSIS — Z8742 Personal history of other diseases of the female genital tract: Secondary | ICD-10-CM | POA: Diagnosis not present

## 2020-05-27 DIAGNOSIS — R87810 Cervical high risk human papillomavirus (HPV) DNA test positive: Secondary | ICD-10-CM | POA: Diagnosis not present

## 2020-05-27 DIAGNOSIS — Z3202 Encounter for pregnancy test, result negative: Secondary | ICD-10-CM | POA: Diagnosis not present

## 2020-05-27 DIAGNOSIS — Z01419 Encounter for gynecological examination (general) (routine) without abnormal findings: Secondary | ICD-10-CM | POA: Diagnosis not present

## 2020-05-27 DIAGNOSIS — Z1151 Encounter for screening for human papillomavirus (HPV): Secondary | ICD-10-CM | POA: Diagnosis not present

## 2020-05-27 DIAGNOSIS — R87613 High grade squamous intraepithelial lesion on cytologic smear of cervix (HGSIL): Secondary | ICD-10-CM | POA: Diagnosis not present

## 2020-06-12 DIAGNOSIS — Z3009 Encounter for other general counseling and advice on contraception: Secondary | ICD-10-CM | POA: Diagnosis not present

## 2020-06-18 DIAGNOSIS — F3341 Major depressive disorder, recurrent, in partial remission: Secondary | ICD-10-CM | POA: Diagnosis not present

## 2020-06-18 DIAGNOSIS — J45909 Unspecified asthma, uncomplicated: Secondary | ICD-10-CM | POA: Diagnosis not present

## 2020-06-18 DIAGNOSIS — E785 Hyperlipidemia, unspecified: Secondary | ICD-10-CM | POA: Diagnosis not present

## 2020-06-18 DIAGNOSIS — Z720 Tobacco use: Secondary | ICD-10-CM | POA: Diagnosis not present

## 2020-06-18 DIAGNOSIS — J302 Other seasonal allergic rhinitis: Secondary | ICD-10-CM | POA: Diagnosis not present

## 2020-06-18 DIAGNOSIS — K219 Gastro-esophageal reflux disease without esophagitis: Secondary | ICD-10-CM | POA: Diagnosis not present

## 2020-08-20 DIAGNOSIS — Z3202 Encounter for pregnancy test, result negative: Secondary | ICD-10-CM | POA: Diagnosis not present

## 2020-08-20 DIAGNOSIS — Z3009 Encounter for other general counseling and advice on contraception: Secondary | ICD-10-CM | POA: Diagnosis not present

## 2020-08-22 DIAGNOSIS — Z01818 Encounter for other preprocedural examination: Secondary | ICD-10-CM | POA: Diagnosis not present

## 2020-08-22 DIAGNOSIS — R9431 Abnormal electrocardiogram [ECG] [EKG]: Secondary | ICD-10-CM | POA: Diagnosis not present

## 2020-08-27 DIAGNOSIS — Z01812 Encounter for preprocedural laboratory examination: Secondary | ICD-10-CM | POA: Diagnosis not present

## 2020-08-27 DIAGNOSIS — Z20822 Contact with and (suspected) exposure to covid-19: Secondary | ICD-10-CM | POA: Diagnosis not present

## 2020-08-27 DIAGNOSIS — Z302 Encounter for sterilization: Secondary | ICD-10-CM | POA: Diagnosis not present

## 2020-08-29 NOTE — H&P (Signed)
 CC: Desires BTL   Subjective:   Michelle Giles is a 34 y.o. [id number] who presents for contraception evaluation. Patient states her pap is current and denies any change in her menstrual history. Her periods are regular and with some mild pain. Patient states she understands the risk of STD with sexual activity and the importance of a monogamous relationship. Risk and benefits of birth control pills, Ortho patch, Nuvaring, Depo-Provera, Nexplanon, IUD, diaphragm, and tubal ligation were discussed with the patient.  Pt desires BTL-  Pt denies breast feeding, smoking, DVT, or pulmonary embolus  Pertinent Past Medical History: none  Menstrual History:         OB History    Gravida  10   Para  6   Term  6   Preterm  0   AB  4   Living  6     SAB  0   TAB  4   Ectopic  0   Molar  0   Multiple  0   Live Births  56          Menarche age: ?   Patient's last menstrual period was 07/11/2020.   Electronically signed by: Kalpen Navin Patel, MD 08/20/2020 10:56 AM  Past Medical History      Past Medical History:  Diagnosis Date  . Abnormal Pap smear of cervix   . Anemia   . Asthma   . Bronchitis   . Mental disorder      Past Surgical History       Past Surgical History:  Procedure Laterality Date  . CERVICAL BIOPSY  W/ LOOP ELECTRODE EXCISION    . COLPOSCOPY    . INDUCED ABORTION       Family History       Family History  Problem Relation Age of Onset  . Hypertension Mother   . Heart disease Mother   . Hypertension Father   . Diabetes Father   . Heart disease Father   . Hypertension Maternal Grandmother   . Diabetes Maternal Grandmother   . Heart disease Maternal Grandmother      Current Medications        Current Outpatient Medications  Medication Sig Dispense Refill  . ferrous sulfate (FERATAB) 325 (65 FE) MG tablet Take 325 mg by mouth daily.    . ibuprofen  (ADVIL ,MOTRIN ) 800 MG tablet TAKE 1 TABLET  BY MOUTH EVERY 8 HOURS AS NEEDED FOR UP TO 7 DAYS FOR PAIN 60 tablet 5  . multivitamin capsule Take 1 capsule by mouth daily.    . norethindrone (ORTHO MICRONOR) 0.35 mg tablet Take 1 tablet (0.35 mg total) by mouth daily. 30 tablet 11  . VENTOLIN  HFA 90 mcg/actuation inhaler INL 2 PFS PO QID  2   No current facility-administered medications for this visit.         Allergies  Allergen Reactions  . Penicillins Itching / Pruritis (ALLERGY/intolerance)   Social History  Social History        Socioeconomic History  . Marital status: Single    Spouse name: Not on file  . Number of children: Not on file  . Years of education: Not on file  . Highest education level: Not on file  Occupational History  . Not on file  Tobacco Use  . Smoking status: Current Every Day Smoker    Packs/day: 0.25    Types: Cigarettes    Last attempt to quit: 08/2018    Years since quitting: 2.0  .  Smokeless tobacco: Never Used  Vaping Use  . Vaping Use: Never used  Substance and Sexual Activity  . Alcohol use: Yes    Comment: one month ago  . Drug use: Not Currently    Types: Marijuana  . Sexual activity: Yes    Partners: Male    Birth control/protection: Condom  Other Topics Concern  . Not on file  Social History Narrative  . Not on file   Social Determinants of Health      Financial Resource Strain:   . Difficulty of Paying Living Expenses:   Food Insecurity:   . Worried About Programme researcher, broadcasting/film/video in the Last Year:   . Barista in the Last Year:   Transportation Needs:   . Freight forwarder (Medical):   SABRA Lack of Transportation (Non-Medical):   Physical Activity:   . Days of Exercise per Week:   . Minutes of Exercise per Session:   Stress:   . Feeling of Stress :   Social Connections:   . Frequency of Communication with Friends and Family:   . Frequency of Social Gatherings with Friends and Family:   . Attends Religious Services:   . Active  Member of Clubs or Organizations:   . Attends Banker Meetings:   SABRA Marital Status:       Review of Systems REVIEW OF SYSTEMS: General: no fever, fatigue, weight or appetite changes Skin: no rashes, lesions, itching, mole change HEENT: no frequent headaches, hearing changes, sore throat, dental problems, sinus problems   Lymph: no tender or swollen lymph nodes  Breasts: no lumps, nipple discharge, breast pain Resp: no SOB, cough, wheezing Cardio: no chest pain, palpitations, edema GI: no N/V/D, constipation, anorexia, bloating, reflux, blood in stool, leakage of stool  GU: no frequency, dysuria, flank pain, hematuria, incontinence, retention       GYN: no change in discharge or odor, itching, inter-menstrual or post-coital bleeding, dyspareunia  Musk: no weakness, arthralgia, joint swelling, ROM limitations Endocrine: no polydypsia, polyphagia, polyuria, heat/cold intolerance, hot flashes, dry skin Psych: no depression, anxiety, panic attacks  Objective:    BP 127/91 (Site: Left arm, Position: Sitting)   Ht 5' 2 (1.575 m)   Wt 247 lb (112 kg)   LMP 07/11/2020   BMI 45.18 kg/m  Constitutional: Well-developed, well-nourished female in no acute distress Respiratory: Clear to auscultation bilaterally. Good air movement with normal work of breathing. Cardiovascular: Regular rate and rhythm. Extremities grossly normal, nontender with no edema; pulses regular Gastrointestinal: Soft, nontender, nondistended. No masses or hernias appreciated. No hepatosplenomegaly. No fluid wave. No rebound or guarding. Genitourinary:                            External Genitalia: Normal female genitalia               Urethral Meatus: Normal caliber and position               Urethra: Midline, no masses               Bladder: Well-suspended, NT               Vagina: Nontender, no lesions.               Cervix: No lesions, normal size and consistency; no cervical motion tenderness                 Uterus: Normal  size and contour; smooth, mobile, NT Adnexa/Parametria: No masses; no parametrial nodularity; no tenderness Perineum/Anus: No lesions   Chaperone present during exam    Assessment/Plan:  Desires BTL  Plan:  Laparoscopic Bilateral Tubal Ligation-Patient was counseled regarding risk, benefits, and alternatives of the procedure.  Patient understands failure rate on 1/100-200, increased risk of ectopic, infection, bleeding, injury to organs,and need to pursue immediate medical attention should pregnancy occur. Pt desire LBTL and consent signed.     Electronically signed by: Kalpen Navin Patel, MD 08/29/20 [phone]

## 2020-08-30 DIAGNOSIS — Z302 Encounter for sterilization: Secondary | ICD-10-CM | POA: Diagnosis not present

## 2020-08-30 NOTE — Unmapped External Note (Signed)
 Procedure(s): LAPAROSCOPIC TUBAL LIGATION Procedure Note  Michelle Giles   [id number] 08/30/2020   08/30/2020  Preop Diagnosis: Desires Permanent Sterization  Postop Diagnosis: Same as above,   Procedure: Laparoscopic Bilateral tubal ligation via bipolar cautery  Anesthesia: GETA  Surgeon: Karole GEANNIE Blanch MD  EBL: <5 cc  Findings: normal uterus,  normal ovaries, normal tubes,   Indication: Pt desires tubal ligation. Risks, benefits, and alternatives were discussed with the patient including bleeding, possible transfusion, infection, and damage to surrounding structures (including bowels, bladder, and ureters along with other blood vessels and nerves).  Regarding sterilization, we discussed the possibility of regret, tubal ligation failure, its permanence, and the possibility of ectopic pregnancy.  She gave her verbal and written consent.  All questions were addressed.  Procedure:  After informed consent was obtained, the patient was taken to the operating room, where general anesthesia was administered without difficulty. She was placed in dorsolithotomy position.  She was prepped and draped in the usual sterile fashion. A uterine manipulator was placed in the uterus after sounding the uterus. The bladder was drained with I and O catheter.  A 10 mm umbilical skin incision was made with the scalpel.  Using the visiport the fascia and peritoneum was entered without difficulty under direct visualization.  CO2 insufflation was started and pneumoperitoneum was obtained. Survey of the abdomen and pelvis revealed the findings as noted above, specifically normal uterus, bilateral tubes and ovaries. The patient was placed in Trendelenburg.   Attention was then turned to the operative laparoscope. The left and right tubes were elevated and followed to the fimbriated ends. The mid section of each tube was cauterized with bipolar cautery. Care was taken to insure that the bowel was well out of the  operative field. A  2 cm portion was cauterized at the ampulla. The gas was turned down and there was no active bleeding noted.  The small amount of bleeding that was seen on the omentum appeared to be stable.    All instruments were removed from the patient's abdomen.The CO2 gas was removed and the port was removed.  The skin incision was closed with subcutaneous stitches of 4-0 Vicryl. The patient tolerated the procedure well. Sponge, lap, and needle counts were correct x 2. She was awakened from anesthesia and transferred to the recovery room in good condition.  Kalpen N. Patel MD        Electronically signed by: Kalpen Navin Patel, MD 08/30/20 [phone]

## 2020-09-18 DIAGNOSIS — E782 Mixed hyperlipidemia: Secondary | ICD-10-CM | POA: Diagnosis not present

## 2020-09-18 DIAGNOSIS — Z720 Tobacco use: Secondary | ICD-10-CM | POA: Diagnosis not present

## 2020-09-18 DIAGNOSIS — K219 Gastro-esophageal reflux disease without esophagitis: Secondary | ICD-10-CM | POA: Diagnosis not present

## 2020-09-18 DIAGNOSIS — J302 Other seasonal allergic rhinitis: Secondary | ICD-10-CM | POA: Diagnosis not present

## 2020-09-18 DIAGNOSIS — F3341 Major depressive disorder, recurrent, in partial remission: Secondary | ICD-10-CM | POA: Diagnosis not present

## 2020-09-18 DIAGNOSIS — J4520 Mild intermittent asthma, uncomplicated: Secondary | ICD-10-CM | POA: Diagnosis not present

## 2020-10-15 DIAGNOSIS — M940 Chondrocostal junction syndrome [Tietze]: Secondary | ICD-10-CM | POA: Diagnosis not present

## 2020-10-15 DIAGNOSIS — R079 Chest pain, unspecified: Secondary | ICD-10-CM | POA: Diagnosis not present

## 2020-10-15 DIAGNOSIS — I440 Atrioventricular block, first degree: Secondary | ICD-10-CM | POA: Diagnosis not present

## 2020-10-15 DIAGNOSIS — I4430 Unspecified atrioventricular block: Secondary | ICD-10-CM | POA: Diagnosis not present

## 2020-10-15 DIAGNOSIS — R0789 Other chest pain: Secondary | ICD-10-CM | POA: Diagnosis not present

## 2020-10-16 DIAGNOSIS — I440 Atrioventricular block, first degree: Secondary | ICD-10-CM | POA: Diagnosis not present

## 2020-10-18 DIAGNOSIS — Z720 Tobacco use: Secondary | ICD-10-CM | POA: Diagnosis not present

## 2020-10-18 DIAGNOSIS — J302 Other seasonal allergic rhinitis: Secondary | ICD-10-CM | POA: Diagnosis not present

## 2020-10-18 DIAGNOSIS — K219 Gastro-esophageal reflux disease without esophagitis: Secondary | ICD-10-CM | POA: Diagnosis not present

## 2020-10-18 DIAGNOSIS — J4520 Mild intermittent asthma, uncomplicated: Secondary | ICD-10-CM | POA: Diagnosis not present

## 2020-10-18 DIAGNOSIS — E782 Mixed hyperlipidemia: Secondary | ICD-10-CM | POA: Diagnosis not present

## 2020-10-18 DIAGNOSIS — F3341 Major depressive disorder, recurrent, in partial remission: Secondary | ICD-10-CM | POA: Diagnosis not present

## 2020-12-18 DIAGNOSIS — Z111 Encounter for screening for respiratory tuberculosis: Secondary | ICD-10-CM | POA: Diagnosis not present

## 2021-01-03 DIAGNOSIS — F3341 Major depressive disorder, recurrent, in partial remission: Secondary | ICD-10-CM | POA: Diagnosis not present

## 2021-01-03 DIAGNOSIS — Z720 Tobacco use: Secondary | ICD-10-CM | POA: Diagnosis not present

## 2021-01-03 DIAGNOSIS — J4520 Mild intermittent asthma, uncomplicated: Secondary | ICD-10-CM | POA: Diagnosis not present

## 2021-01-03 DIAGNOSIS — E782 Mixed hyperlipidemia: Secondary | ICD-10-CM | POA: Diagnosis not present

## 2021-01-03 DIAGNOSIS — K219 Gastro-esophageal reflux disease without esophagitis: Secondary | ICD-10-CM | POA: Diagnosis not present

## 2021-01-03 DIAGNOSIS — L0292 Furuncle, unspecified: Secondary | ICD-10-CM | POA: Diagnosis not present

## 2021-01-03 DIAGNOSIS — J302 Other seasonal allergic rhinitis: Secondary | ICD-10-CM | POA: Diagnosis not present

## 2021-01-03 DIAGNOSIS — M722 Plantar fascial fibromatosis: Secondary | ICD-10-CM | POA: Diagnosis not present

## 2021-01-16 DIAGNOSIS — Z20822 Contact with and (suspected) exposure to covid-19: Secondary | ICD-10-CM | POA: Diagnosis not present

## 2021-02-21 DIAGNOSIS — K5900 Constipation, unspecified: Secondary | ICD-10-CM | POA: Diagnosis not present

## 2021-02-21 DIAGNOSIS — R519 Headache, unspecified: Secondary | ICD-10-CM | POA: Diagnosis not present

## 2021-02-21 DIAGNOSIS — Z3202 Encounter for pregnancy test, result negative: Secondary | ICD-10-CM | POA: Diagnosis not present

## 2021-02-21 DIAGNOSIS — R000 Tachycardia, unspecified: Secondary | ICD-10-CM | POA: Diagnosis not present

## 2021-02-21 DIAGNOSIS — R110 Nausea: Secondary | ICD-10-CM | POA: Diagnosis not present

## 2021-02-21 DIAGNOSIS — R1084 Generalized abdominal pain: Secondary | ICD-10-CM | POA: Diagnosis not present

## 2021-02-22 DIAGNOSIS — R000 Tachycardia, unspecified: Secondary | ICD-10-CM | POA: Diagnosis not present

## 2021-04-04 DIAGNOSIS — F3341 Major depressive disorder, recurrent, in partial remission: Secondary | ICD-10-CM | POA: Diagnosis not present

## 2021-04-04 DIAGNOSIS — E782 Mixed hyperlipidemia: Secondary | ICD-10-CM | POA: Diagnosis not present

## 2021-04-04 DIAGNOSIS — Z720 Tobacco use: Secondary | ICD-10-CM | POA: Diagnosis not present

## 2021-04-04 DIAGNOSIS — G4489 Other headache syndrome: Secondary | ICD-10-CM | POA: Diagnosis not present

## 2021-04-04 DIAGNOSIS — J4520 Mild intermittent asthma, uncomplicated: Secondary | ICD-10-CM | POA: Diagnosis not present

## 2021-04-04 DIAGNOSIS — K219 Gastro-esophageal reflux disease without esophagitis: Secondary | ICD-10-CM | POA: Diagnosis not present

## 2021-04-04 DIAGNOSIS — J302 Other seasonal allergic rhinitis: Secondary | ICD-10-CM | POA: Diagnosis not present

## 2021-04-04 DIAGNOSIS — L0293 Carbuncle, unspecified: Secondary | ICD-10-CM | POA: Diagnosis not present

## 2021-04-25 DIAGNOSIS — J302 Other seasonal allergic rhinitis: Secondary | ICD-10-CM | POA: Diagnosis not present

## 2021-04-25 DIAGNOSIS — K219 Gastro-esophageal reflux disease without esophagitis: Secondary | ICD-10-CM | POA: Diagnosis not present

## 2021-04-25 DIAGNOSIS — E782 Mixed hyperlipidemia: Secondary | ICD-10-CM | POA: Diagnosis not present

## 2021-04-25 DIAGNOSIS — Z720 Tobacco use: Secondary | ICD-10-CM | POA: Diagnosis not present

## 2021-04-25 DIAGNOSIS — F3341 Major depressive disorder, recurrent, in partial remission: Secondary | ICD-10-CM | POA: Diagnosis not present

## 2021-04-25 DIAGNOSIS — G4489 Other headache syndrome: Secondary | ICD-10-CM | POA: Diagnosis not present

## 2021-04-25 DIAGNOSIS — J4520 Mild intermittent asthma, uncomplicated: Secondary | ICD-10-CM | POA: Diagnosis not present

## 2021-07-10 DIAGNOSIS — J4520 Mild intermittent asthma, uncomplicated: Secondary | ICD-10-CM | POA: Diagnosis not present

## 2021-07-10 DIAGNOSIS — K219 Gastro-esophageal reflux disease without esophagitis: Secondary | ICD-10-CM | POA: Diagnosis not present

## 2021-07-10 DIAGNOSIS — Z720 Tobacco use: Secondary | ICD-10-CM | POA: Diagnosis not present

## 2021-07-10 DIAGNOSIS — F3341 Major depressive disorder, recurrent, in partial remission: Secondary | ICD-10-CM | POA: Diagnosis not present

## 2021-07-10 DIAGNOSIS — E782 Mixed hyperlipidemia: Secondary | ICD-10-CM | POA: Diagnosis not present

## 2021-07-10 DIAGNOSIS — Z0001 Encounter for general adult medical examination with abnormal findings: Secondary | ICD-10-CM | POA: Diagnosis not present

## 2021-07-10 DIAGNOSIS — R5383 Other fatigue: Secondary | ICD-10-CM | POA: Diagnosis not present

## 2021-07-10 DIAGNOSIS — Z9189 Other specified personal risk factors, not elsewhere classified: Secondary | ICD-10-CM | POA: Diagnosis not present

## 2021-07-10 DIAGNOSIS — Z113 Encounter for screening for infections with a predominantly sexual mode of transmission: Secondary | ICD-10-CM | POA: Diagnosis not present

## 2021-07-10 DIAGNOSIS — Z6841 Body Mass Index (BMI) 40.0 and over, adult: Secondary | ICD-10-CM | POA: Diagnosis not present

## 2021-07-10 DIAGNOSIS — J302 Other seasonal allergic rhinitis: Secondary | ICD-10-CM | POA: Diagnosis not present

## 2021-08-27 DIAGNOSIS — F17210 Nicotine dependence, cigarettes, uncomplicated: Secondary | ICD-10-CM | POA: Insufficient documentation

## 2021-08-27 DIAGNOSIS — R400 Somnolence: Secondary | ICD-10-CM | POA: Diagnosis not present

## 2021-08-27 DIAGNOSIS — R5383 Other fatigue: Secondary | ICD-10-CM | POA: Insufficient documentation

## 2021-08-27 DIAGNOSIS — E782 Mixed hyperlipidemia: Secondary | ICD-10-CM | POA: Insufficient documentation

## 2021-08-27 DIAGNOSIS — R0683 Snoring: Secondary | ICD-10-CM | POA: Diagnosis not present

## 2021-08-27 DIAGNOSIS — K219 Gastro-esophageal reflux disease without esophagitis: Secondary | ICD-10-CM | POA: Insufficient documentation

## 2021-08-27 DIAGNOSIS — R29818 Other symptoms and signs involving the nervous system: Secondary | ICD-10-CM | POA: Diagnosis not present

## 2021-08-27 DIAGNOSIS — T7840XA Allergy, unspecified, initial encounter: Secondary | ICD-10-CM | POA: Insufficient documentation

## 2021-10-15 DIAGNOSIS — Z1231 Encounter for screening mammogram for malignant neoplasm of breast: Secondary | ICD-10-CM | POA: Diagnosis not present

## 2021-10-21 DIAGNOSIS — R400 Somnolence: Secondary | ICD-10-CM | POA: Diagnosis not present

## 2021-10-21 DIAGNOSIS — R0683 Snoring: Secondary | ICD-10-CM | POA: Diagnosis not present

## 2021-11-07 DIAGNOSIS — R928 Other abnormal and inconclusive findings on diagnostic imaging of breast: Secondary | ICD-10-CM | POA: Diagnosis not present

## 2021-11-07 DIAGNOSIS — R922 Inconclusive mammogram: Secondary | ICD-10-CM | POA: Diagnosis not present

## 2021-11-10 DIAGNOSIS — Z87898 Personal history of other specified conditions: Secondary | ICD-10-CM | POA: Diagnosis not present

## 2021-11-28 DIAGNOSIS — R928 Other abnormal and inconclusive findings on diagnostic imaging of breast: Secondary | ICD-10-CM | POA: Diagnosis not present

## 2021-11-28 DIAGNOSIS — D242 Benign neoplasm of left breast: Secondary | ICD-10-CM | POA: Diagnosis not present

## 2021-11-28 DIAGNOSIS — N6321 Unspecified lump in the left breast, upper outer quadrant: Secondary | ICD-10-CM | POA: Diagnosis not present

## 2022-02-11 DIAGNOSIS — R5383 Other fatigue: Secondary | ICD-10-CM | POA: Diagnosis not present

## 2022-02-11 DIAGNOSIS — E782 Mixed hyperlipidemia: Secondary | ICD-10-CM | POA: Diagnosis not present

## 2022-02-11 DIAGNOSIS — K219 Gastro-esophageal reflux disease without esophagitis: Secondary | ICD-10-CM | POA: Diagnosis not present

## 2022-02-11 DIAGNOSIS — Z9189 Other specified personal risk factors, not elsewhere classified: Secondary | ICD-10-CM | POA: Diagnosis not present

## 2022-02-11 DIAGNOSIS — J302 Other seasonal allergic rhinitis: Secondary | ICD-10-CM | POA: Diagnosis not present

## 2022-02-11 DIAGNOSIS — Z720 Tobacco use: Secondary | ICD-10-CM | POA: Diagnosis not present

## 2022-02-11 DIAGNOSIS — M79604 Pain in right leg: Secondary | ICD-10-CM | POA: Diagnosis not present

## 2022-02-11 DIAGNOSIS — J4520 Mild intermittent asthma, uncomplicated: Secondary | ICD-10-CM | POA: Diagnosis not present

## 2022-03-11 DIAGNOSIS — Z9189 Other specified personal risk factors, not elsewhere classified: Secondary | ICD-10-CM | POA: Diagnosis not present

## 2022-03-11 DIAGNOSIS — N3001 Acute cystitis with hematuria: Secondary | ICD-10-CM | POA: Diagnosis not present

## 2022-03-11 DIAGNOSIS — J302 Other seasonal allergic rhinitis: Secondary | ICD-10-CM | POA: Diagnosis not present

## 2022-03-11 DIAGNOSIS — Z720 Tobacco use: Secondary | ICD-10-CM | POA: Diagnosis not present

## 2022-03-11 DIAGNOSIS — R42 Dizziness and giddiness: Secondary | ICD-10-CM | POA: Diagnosis not present

## 2022-03-11 DIAGNOSIS — M79604 Pain in right leg: Secondary | ICD-10-CM | POA: Diagnosis not present

## 2022-03-11 DIAGNOSIS — K219 Gastro-esophageal reflux disease without esophagitis: Secondary | ICD-10-CM | POA: Diagnosis not present

## 2022-03-11 DIAGNOSIS — E782 Mixed hyperlipidemia: Secondary | ICD-10-CM | POA: Diagnosis not present

## 2022-03-11 DIAGNOSIS — J4520 Mild intermittent asthma, uncomplicated: Secondary | ICD-10-CM | POA: Diagnosis not present

## 2022-03-25 DIAGNOSIS — M79604 Pain in right leg: Secondary | ICD-10-CM | POA: Diagnosis not present

## 2022-03-25 DIAGNOSIS — K219 Gastro-esophageal reflux disease without esophagitis: Secondary | ICD-10-CM | POA: Diagnosis not present

## 2022-03-25 DIAGNOSIS — J4520 Mild intermittent asthma, uncomplicated: Secondary | ICD-10-CM | POA: Diagnosis not present

## 2022-03-25 DIAGNOSIS — J302 Other seasonal allergic rhinitis: Secondary | ICD-10-CM | POA: Diagnosis not present

## 2022-03-25 DIAGNOSIS — E782 Mixed hyperlipidemia: Secondary | ICD-10-CM | POA: Diagnosis not present

## 2022-03-25 DIAGNOSIS — Z9189 Other specified personal risk factors, not elsewhere classified: Secondary | ICD-10-CM | POA: Diagnosis not present

## 2022-03-25 DIAGNOSIS — R42 Dizziness and giddiness: Secondary | ICD-10-CM | POA: Diagnosis not present

## 2022-03-25 DIAGNOSIS — Z720 Tobacco use: Secondary | ICD-10-CM | POA: Diagnosis not present

## 2022-04-30 ENCOUNTER — Ambulatory Visit (INDEPENDENT_AMBULATORY_CARE_PROVIDER_SITE_OTHER): Payer: Medicare Other | Admitting: Neurology

## 2022-04-30 ENCOUNTER — Encounter: Payer: Self-pay | Admitting: Neurology

## 2022-04-30 VITALS — BP 142/103 | HR 78 | Ht 62.0 in | Wt 248.0 lb

## 2022-04-30 DIAGNOSIS — R55 Syncope and collapse: Secondary | ICD-10-CM | POA: Diagnosis not present

## 2022-04-30 DIAGNOSIS — G43009 Migraine without aura, not intractable, without status migrainosus: Secondary | ICD-10-CM | POA: Diagnosis not present

## 2022-04-30 MED ORDER — TOPIRAMATE 25 MG PO TABS: 25.0000 mg | ORAL_TABLET | Freq: Every evening | ORAL | 6 refills | Status: DC

## 2022-04-30 NOTE — Progress Notes (Signed)
? ?[facility] ? ?PATIENT: Michelle Giles ?DOB: [birth year] ? ?REQUESTING CLINICIAN: Trey Sailors, PA ?HISTORY FROM: Patient  ?REASON FOR VISIT: Headaches/Dizziness  ? ? ?HISTORICAL ? ?CHIEF COMPLAINT:  ?Chief Complaint  ?Patient presents with  ? New Patient (Initial Visit)  ?  Rm 13. Alone. ?NP/Paper/Ashley Vanstory PA [facility]/Ongoing dizziness and presyncope.  ? ? ?HISTORY OF PRESENT ILLNESS:  ?This is a 36 year old woman past medical history of asthma who is presenting with complaint of headaches.  Patient reports bifrontal headaches every other day lasting a few minutes but they can recur throughout the day.  She reports sometimes headache will wake her up from sleep.  She has sensitivity to light but no nausea, no vomiting associated with the headache.  She feels that sometimes headaches are worsened by stress or after smoking cigarettes.   ?She is very concerned about her headaches because she is losing friends and family members due to brain aneurysm rupture. ?Patient is also complaining of dizziness described as lightheadedness that is intermittent.  When she has the dizziness it is only for few seconds and dizziness will improve.  No falls, no nausea no vomiting associated with the dizziness. ?She reports 1 month ago she had a syncopal episode for 5-minutes, denies any injury from that episode.  She reports she was at work, felt dizzy and next thing she knows people around her asking if she is okay.  She did not present to the ED after that episode. ?Patient reported she is a smoker and she is trying to quit. ? ? ?OTHER MEDICAL CONDITIONS: Asthma  ? ? ?REVIEW OF SYSTEMS: Full 14 system review of systems performed and negative with exception of: as noted in the HPI  ? ?ALLERGIES: ?Allergies  ?Allergen Reactions  ? Penicillins Itching  ? ? ?HOME MEDICATIONS: ?Outpatient Medications Prior to Visit  ?Medication Sig Dispense Refill  ? ALBUTEROL IN Inhale into the lungs.     ? omeprazole (PRILOSEC) 20 MG capsule Take 1 capsule (20 mg total) by mouth daily. 30 capsule 0  ? ondansetron (ZOFRAN) 4 MG tablet Take 1 tablet (4 mg total) by mouth every 6 (six) hours as needed for nausea or vomiting. 12 tablet 0  ? potassium chloride 20 MEQ TBCR Take 20 mEq by mouth daily. 10 tablet 0  ? valACYclovir (VALTREX) 1000 MG tablet Take 1 tablet (1,000 mg total) by mouth 3 (three) times daily. 21 tablet 0  ? ?No facility-administered medications prior to visit.  ? ? ?PAST MEDICAL HISTORY: ?Past Medical History:  ?Diagnosis Date  ? Asthma   ? ? ?PAST SURGICAL HISTORY: ?History reviewed. No pertinent surgical history. ? ?FAMILY HISTORY: ?History reviewed. No pertinent family history. ? ?SOCIAL HISTORY: ?Social History  ? ?Socioeconomic History  ? Marital status: Single  ?  Spouse name: Not on file  ? Number of children: Not on file  ? Years of education: Not on file  ? Highest education level: Not on file  ?Occupational History  ? Not on file  ?Tobacco Use  ? Smoking status: Former  ?  Types: Cigarettes  ? Smokeless tobacco: Never  ? Tobacco comments:  ?  preg  ?Vaping Use  ? Vaping Use: Former  ?Substance and Sexual Activity  ? Alcohol use: Not Currently  ?  Comment: preg  ? Drug use: No  ? Sexual activity: Not on file  ?Other Topics Concern  ? Not on file  ?Social History Narrative  ? ** Merged History Encounter **  ?    ? ?  Social Determinants of Health  ? ?Financial Resource Strain: Not on file  ?Food Insecurity: Not on file  ?Transportation Needs: Not on file  ?Physical Activity: Not on file  ?Stress: Not on file  ?Social Connections: Not on file  ?Intimate Partner Violence: Not on file  ? ? ?PHYSICAL EXAM ? ? ?GENERAL EXAM/CONSTITUTIONAL: ?Vitals:  ?Vitals:  ? 04/30/22 1027 04/30/22 1031  ?BP: 115/81 (!) 142/103  ?Pulse: 75 78  ?Weight: 248 lb (112.5 kg)   ?Height: '5\' 2"'$  (1.575 m)   ? ?Body mass index is 45.36 kg/m?. ?Wt Readings from Last 3 Encounters:  ?04/30/22 248 lb (112.5 kg)  ?12/09/18 224  lb 6.9 oz (101.8 kg)  ?12/02/17 222 lb (100.7 kg)  ? ?Patient is in no distress; well developed, nourished and groomed; neck is supple ? ?EYES: ?Pupils round and reactive to light, Visual fields full to confrontation, Extraocular movements intacts,  ? ?MUSCULOSKELETAL: ?Gait, strength, tone, movements noted in Neurologic exam below ? ?NEUROLOGIC: ?MENTAL STATUS:  ?   ? View : No data to display.  ?  ?  ?  ? ?awake, alert, oriented to person, place and time ?recent and remote memory intact ?normal attention and concentration ?language fluent, comprehension intact, naming intact ?fund of knowledge appropriate ? ?CRANIAL NERVE:  ?2nd, 3rd, 4th, 6th - pupils equal and reactive to light, visual fields full to confrontation, extraocular muscles intact, no nystagmus ?5th - facial sensation symmetric ?7th - facial strength symmetric ?8th - hearing intact ?9th - palate elevates symmetrically, uvula midline ?11th - shoulder shrug symmetric ?12th - tongue protrusion midline ? ?MOTOR:  ?normal bulk and tone, full strength in the BUE, BLE ? ?SENSORY:  ?normal and symmetric to light touch, pinprick, temperature, vibration ? ?COORDINATION:  ?finger-nose-finger, fine finger movements normal ? ?REFLEXES:  ?deep tendon reflexes present and symmetric ? ?GAIT/STATION:  ?normal ? ? ?DIAGNOSTIC DATA (LABS, IMAGING, TESTING) ?- I reviewed patient records, labs, notes, testing and imaging myself where available. ? ?Lab Results  ?Component Value Date  ? WBC 12.1 (H) 11/24/2016  ? HGB 9.1 (L) 11/24/2016  ? HCT 26.8 (L) 11/24/2016  ? MCV 75.1 (L) 11/24/2016  ? PLT 222 11/24/2016  ? ?   ?Component Value Date/Time  ? NA 140 11/24/2016 0156  ? K 2.7 (LL) 11/24/2016 0156  ? CL 110 11/24/2016 0156  ? CO2 22 11/24/2016 0156  ? GLUCOSE 96 11/24/2016 0156  ? BUN 5 (L) 11/24/2016 0156  ? CREATININE 0.60 11/24/2016 0156  ? CALCIUM 8.0 (L) 11/24/2016 0156  ? PROT 6.5 11/24/2016 0156  ? ALBUMIN 2.8 (L) 11/24/2016 0156  ? AST 17 11/24/2016 0156  ? ALT 11  (L) 11/24/2016 0156  ? ALKPHOS 73 11/24/2016 0156  ? BILITOT 0.6 11/24/2016 0156  ? GFRNONAA >60 11/24/2016 0156  ? GFRAA >60 11/24/2016 0156  ? ?No results found for: CHOL, HDL, LDLCALC, LDLDIRECT, TRIG, CHOLHDL ?No results found for: HGBA1C ?No results found for: VITAMINB12 ?No results found for: TSH ? ? ? ?ASSESSMENT AND PLAN ? ?36 y.o. year old female with history of asthma who is presenting with new complaint of headaches, described headaches as bifrontal, occurring every other day associated with sensitivity to light, no nausea, no vomiting associated with the headaches.  Patient reports at times headaches can wake her up from her sleep.  Since this is a new onset headache, I will start by obtaining a brain MRI without contrast.  If this is a primary headache, would likely treat as migraine  headache.  I will start patient on topiramate 25 mg nightly for migraine prevention.  She reported having bilateral tubal ligation.  I will contact her after the completion of the MRI otherwise I will see her in 3 months for follow-up. ? ? ?1. Migraine without aura and without status migrainosus, not intractable   ?2. Syncope, unspecified syncope type   ? ? ? ?Patient Instructions  ?Continue current medication ?MRI brain without contrast, I will contact you to go over the results ?Recommend smoking cessation ?Follow-up in 3 months ? ? ?Orders Placed This Encounter  ?Procedures  ? MR BRAIN WO CONTRAST  ? ? ?Meds ordered this encounter  ?Medications  ? topiramate (TOPAMAX) 25 MG tablet  ?  Sig: Take 1 tablet (25 mg total) by mouth at bedtime.  ?  Dispense:  30 tablet  ?  Refill:  6  ? ? ?Return in about 3 months (around 07/31/2022). ? ?I have spent a total of 48 minutes dedicated to this patient today, preparing to see patient, performing a medically appropriate examination and evaluation, ordering tests and/or medications and procedures, and counseling and educating the patient/family/caregiver; independently interpreting  result and communicating results to the family/patient/caregiver; and documenting clinical information in the electronic medical record. ? ? ?Alric Ran, MD 04/30/2022, 4:22 PM ? ?[facility]

## 2022-04-30 NOTE — Patient Instructions (Signed)
Continue current medication ?MRI brain without contrast, I will contact you to go over the results ?Recommend smoking cessation ?Follow-up in 3 months ? ?

## 2022-05-01 ENCOUNTER — Telehealth: Payer: Self-pay | Admitting: Neurology

## 2022-05-01 NOTE — Telephone Encounter (Signed)
[employer] medicare NPR sent to GI they will call the patient to schedule 

## 2022-05-09 ENCOUNTER — Other Ambulatory Visit: Payer: Medicaid Other

## 2022-06-02 ENCOUNTER — Other Ambulatory Visit: Payer: Self-pay | Admitting: Neurology

## 2022-06-02 ENCOUNTER — Telehealth: Payer: Self-pay | Admitting: Neurology

## 2022-06-02 MED ORDER — AMITRIPTYLINE HCL 10 MG PO TABS: 10.0000 mg | ORAL_TABLET | Freq: Every day | ORAL | 3 refills | Status: DC

## 2022-06-02 NOTE — Telephone Encounter (Signed)
Please advise patient to discontinue Topamax and to start Elavil 10 mg nightly for headaches prevention. I will follow up with her in August as scheduled.

## 2022-06-02 NOTE — Addendum Note (Signed)
Addended by: Lester Berlin A on: 06/02/2022 04:35 PM   Modules accepted: Orders

## 2022-06-02 NOTE — Telephone Encounter (Signed)
At 9:05 this morning pt left a vm stating she is experiencing tingling and not feeling right from the topiramate (TOPAMAX) 25 MG tablet , pt is asking for a call

## 2022-06-02 NOTE — Telephone Encounter (Signed)
I called patient to discuss. Patient's daughter answered the phone and reports the patient is sleeping and they will call us back when she wakes up. No DPR on file. Please route to POD 2 calls.

## 2022-06-02 NOTE — Telephone Encounter (Signed)
I called patient. I advised her to stop the topamax and start elavil QHS. She needs it sent to [facility] [address] in [place]. I took care of this. She had oral surgery today and would like to finish her 4 day hydrocodone course before starting elavil. Patient will follow up as scheduled in August with Dr. April Manson.

## 2022-06-02 NOTE — Telephone Encounter (Signed)
I called patient.  Her daughter assisted with this phone call. Patient was present but it is quite difficult to understand her. Apparently, patient had teeth pulled and was having difficulty speaking.  Patient reports that since she started the Topamax she has noticed tingling in her lips, arms, and legs.  She has not felt herself.  She could not elaborate on this.  She thinks that the Topamax is helping reduce her headaches at night.  She is wondering if Dr. April Manson recommends anything else.

## 2022-06-08 ENCOUNTER — Ambulatory Visit
Admission: RE | Admit: 2022-06-08 | Discharge: 2022-06-08 | Disposition: A | Discharge status: Home / Self Care | Payer: Medicare Other | Source: Ambulatory Visit | Attending: Neurology | Admitting: Neurology

## 2022-06-08 DIAGNOSIS — R519 Headache, unspecified: Secondary | ICD-10-CM | POA: Diagnosis not present

## 2022-06-08 DIAGNOSIS — G43009 Migraine without aura, not intractable, without status migrainosus: Secondary | ICD-10-CM

## 2022-06-10 NOTE — Progress Notes (Signed)
Discussed with patient MRI results showing empty sella and few punctate Flair foci. She is reporting the Elavil is helping with her headaches.  Informed patient if headaches get worse, we will proceed with LP to check for opening pressure. She is comfortable with plans. I will see her August for follow up.   Dr. April Manson

## 2022-06-11 DIAGNOSIS — R5383 Other fatigue: Secondary | ICD-10-CM | POA: Diagnosis not present

## 2022-06-11 DIAGNOSIS — E559 Vitamin D deficiency, unspecified: Secondary | ICD-10-CM | POA: Diagnosis not present

## 2022-06-11 DIAGNOSIS — R0602 Shortness of breath: Secondary | ICD-10-CM | POA: Diagnosis not present

## 2022-06-11 DIAGNOSIS — Z79899 Other long term (current) drug therapy: Secondary | ICD-10-CM | POA: Diagnosis not present

## 2022-06-17 DIAGNOSIS — G43111 Migraine with aura, intractable, with status migrainosus: Secondary | ICD-10-CM | POA: Diagnosis not present

## 2022-06-17 DIAGNOSIS — J302 Other seasonal allergic rhinitis: Secondary | ICD-10-CM | POA: Diagnosis not present

## 2022-06-17 DIAGNOSIS — Z720 Tobacco use: Secondary | ICD-10-CM | POA: Diagnosis not present

## 2022-06-17 DIAGNOSIS — Z9189 Other specified personal risk factors, not elsewhere classified: Secondary | ICD-10-CM | POA: Diagnosis not present

## 2022-06-17 DIAGNOSIS — J4520 Mild intermittent asthma, uncomplicated: Secondary | ICD-10-CM | POA: Diagnosis not present

## 2022-06-17 DIAGNOSIS — E782 Mixed hyperlipidemia: Secondary | ICD-10-CM | POA: Diagnosis not present

## 2022-06-17 DIAGNOSIS — M79604 Pain in right leg: Secondary | ICD-10-CM | POA: Diagnosis not present

## 2022-06-17 DIAGNOSIS — K219 Gastro-esophageal reflux disease without esophagitis: Secondary | ICD-10-CM | POA: Diagnosis not present

## 2022-06-18 DIAGNOSIS — E559 Vitamin D deficiency, unspecified: Secondary | ICD-10-CM | POA: Diagnosis not present

## 2022-07-17 DIAGNOSIS — I10 Essential (primary) hypertension: Secondary | ICD-10-CM | POA: Diagnosis not present

## 2022-07-17 DIAGNOSIS — E559 Vitamin D deficiency, unspecified: Secondary | ICD-10-CM | POA: Diagnosis not present

## 2022-07-29 DIAGNOSIS — K219 Gastro-esophageal reflux disease without esophagitis: Secondary | ICD-10-CM | POA: Diagnosis not present

## 2022-07-29 DIAGNOSIS — J302 Other seasonal allergic rhinitis: Secondary | ICD-10-CM | POA: Diagnosis not present

## 2022-07-29 DIAGNOSIS — J4520 Mild intermittent asthma, uncomplicated: Secondary | ICD-10-CM | POA: Diagnosis not present

## 2022-07-29 DIAGNOSIS — M79671 Pain in right foot: Secondary | ICD-10-CM | POA: Diagnosis not present

## 2022-07-29 DIAGNOSIS — Z0001 Encounter for general adult medical examination with abnormal findings: Secondary | ICD-10-CM | POA: Diagnosis not present

## 2022-07-29 DIAGNOSIS — E782 Mixed hyperlipidemia: Secondary | ICD-10-CM | POA: Diagnosis not present

## 2022-07-29 DIAGNOSIS — R5383 Other fatigue: Secondary | ICD-10-CM | POA: Diagnosis not present

## 2022-07-29 DIAGNOSIS — Z9189 Other specified personal risk factors, not elsewhere classified: Secondary | ICD-10-CM | POA: Diagnosis not present

## 2022-07-29 DIAGNOSIS — Z720 Tobacco use: Secondary | ICD-10-CM | POA: Diagnosis not present

## 2022-08-04 ENCOUNTER — Ambulatory Visit: Payer: Medicare Other | Admitting: Neurology

## 2022-08-19 DIAGNOSIS — E559 Vitamin D deficiency, unspecified: Secondary | ICD-10-CM | POA: Diagnosis not present

## 2022-08-19 DIAGNOSIS — I10 Essential (primary) hypertension: Secondary | ICD-10-CM | POA: Diagnosis not present

## 2022-08-26 DIAGNOSIS — E782 Mixed hyperlipidemia: Secondary | ICD-10-CM | POA: Diagnosis not present

## 2022-08-26 DIAGNOSIS — Z0001 Encounter for general adult medical examination with abnormal findings: Secondary | ICD-10-CM | POA: Diagnosis not present

## 2022-08-26 DIAGNOSIS — M79671 Pain in right foot: Secondary | ICD-10-CM | POA: Diagnosis not present

## 2022-08-26 DIAGNOSIS — J302 Other seasonal allergic rhinitis: Secondary | ICD-10-CM | POA: Diagnosis not present

## 2022-08-26 DIAGNOSIS — R5383 Other fatigue: Secondary | ICD-10-CM | POA: Diagnosis not present

## 2022-08-26 DIAGNOSIS — J4520 Mild intermittent asthma, uncomplicated: Secondary | ICD-10-CM | POA: Diagnosis not present

## 2022-08-26 DIAGNOSIS — K219 Gastro-esophageal reflux disease without esophagitis: Secondary | ICD-10-CM | POA: Diagnosis not present

## 2022-08-26 DIAGNOSIS — Z9189 Other specified personal risk factors, not elsewhere classified: Secondary | ICD-10-CM | POA: Diagnosis not present

## 2022-08-26 DIAGNOSIS — Z720 Tobacco use: Secondary | ICD-10-CM | POA: Diagnosis not present

## 2022-09-02 ENCOUNTER — Encounter: Payer: Self-pay | Admitting: Neurology

## 2022-09-02 ENCOUNTER — Ambulatory Visit: Payer: Medicare Other | Admitting: Neurology

## 2022-09-18 DIAGNOSIS — I10 Essential (primary) hypertension: Secondary | ICD-10-CM | POA: Diagnosis not present

## 2022-09-18 DIAGNOSIS — Z79899 Other long term (current) drug therapy: Secondary | ICD-10-CM | POA: Diagnosis not present

## 2022-09-18 DIAGNOSIS — M2550 Pain in unspecified joint: Secondary | ICD-10-CM | POA: Diagnosis not present

## 2022-09-18 DIAGNOSIS — M129 Arthropathy, unspecified: Secondary | ICD-10-CM | POA: Diagnosis not present

## 2022-09-18 DIAGNOSIS — R5383 Other fatigue: Secondary | ICD-10-CM | POA: Diagnosis not present

## 2022-09-18 DIAGNOSIS — E559 Vitamin D deficiency, unspecified: Secondary | ICD-10-CM | POA: Diagnosis not present

## 2022-10-12 ENCOUNTER — Telehealth: Payer: Self-pay | Admitting: Neurology

## 2022-10-12 ENCOUNTER — Ambulatory Visit: Payer: Medicare Other | Admitting: Neurology

## 2022-10-12 NOTE — Telephone Encounter (Signed)
Pt cancelled appt due to went  to work and forgot appt was today

## 2022-10-14 DIAGNOSIS — J4520 Mild intermittent asthma, uncomplicated: Secondary | ICD-10-CM | POA: Diagnosis not present

## 2022-10-14 DIAGNOSIS — Z720 Tobacco use: Secondary | ICD-10-CM | POA: Diagnosis not present

## 2022-10-14 DIAGNOSIS — K219 Gastro-esophageal reflux disease without esophagitis: Secondary | ICD-10-CM | POA: Diagnosis not present

## 2022-10-14 DIAGNOSIS — J302 Other seasonal allergic rhinitis: Secondary | ICD-10-CM | POA: Diagnosis not present

## 2022-10-14 DIAGNOSIS — Z9189 Other specified personal risk factors, not elsewhere classified: Secondary | ICD-10-CM | POA: Diagnosis not present

## 2022-10-14 DIAGNOSIS — R5383 Other fatigue: Secondary | ICD-10-CM | POA: Diagnosis not present

## 2022-10-14 DIAGNOSIS — E782 Mixed hyperlipidemia: Secondary | ICD-10-CM | POA: Diagnosis not present

## 2022-10-15 DIAGNOSIS — E559 Vitamin D deficiency, unspecified: Secondary | ICD-10-CM | POA: Diagnosis not present

## 2022-10-15 DIAGNOSIS — M129 Arthropathy, unspecified: Secondary | ICD-10-CM | POA: Diagnosis not present

## 2022-10-15 DIAGNOSIS — I10 Essential (primary) hypertension: Secondary | ICD-10-CM | POA: Diagnosis not present

## 2022-10-15 DIAGNOSIS — Z79899 Other long term (current) drug therapy: Secondary | ICD-10-CM | POA: Diagnosis not present

## 2022-10-15 DIAGNOSIS — R5383 Other fatigue: Secondary | ICD-10-CM | POA: Diagnosis not present

## 2022-10-15 DIAGNOSIS — M2550 Pain in unspecified joint: Secondary | ICD-10-CM | POA: Diagnosis not present

## 2022-10-28 DIAGNOSIS — R5383 Other fatigue: Secondary | ICD-10-CM | POA: Diagnosis not present

## 2022-10-28 DIAGNOSIS — J302 Other seasonal allergic rhinitis: Secondary | ICD-10-CM | POA: Diagnosis not present

## 2022-10-28 DIAGNOSIS — J4520 Mild intermittent asthma, uncomplicated: Secondary | ICD-10-CM | POA: Diagnosis not present

## 2022-10-28 DIAGNOSIS — Y999 Unspecified external cause status: Secondary | ICD-10-CM | POA: Diagnosis not present

## 2022-10-28 DIAGNOSIS — Z720 Tobacco use: Secondary | ICD-10-CM | POA: Diagnosis not present

## 2022-10-28 DIAGNOSIS — Y92410 Unspecified street and highway as the place of occurrence of the external cause: Secondary | ICD-10-CM | POA: Diagnosis not present

## 2022-10-28 DIAGNOSIS — E782 Mixed hyperlipidemia: Secondary | ICD-10-CM | POA: Diagnosis not present

## 2022-10-28 DIAGNOSIS — S29012A Strain of muscle and tendon of back wall of thorax, initial encounter: Secondary | ICD-10-CM | POA: Diagnosis not present

## 2022-10-28 DIAGNOSIS — Z9189 Other specified personal risk factors, not elsewhere classified: Secondary | ICD-10-CM | POA: Diagnosis not present

## 2022-10-28 DIAGNOSIS — K219 Gastro-esophageal reflux disease without esophagitis: Secondary | ICD-10-CM | POA: Diagnosis not present

## 2022-11-04 DIAGNOSIS — H1031 Unspecified acute conjunctivitis, right eye: Secondary | ICD-10-CM | POA: Diagnosis not present

## 2022-11-13 DIAGNOSIS — I10 Essential (primary) hypertension: Secondary | ICD-10-CM | POA: Diagnosis not present

## 2022-11-13 DIAGNOSIS — M2550 Pain in unspecified joint: Secondary | ICD-10-CM | POA: Diagnosis not present

## 2022-11-13 DIAGNOSIS — L299 Pruritus, unspecified: Secondary | ICD-10-CM | POA: Diagnosis not present

## 2022-11-13 DIAGNOSIS — R5383 Other fatigue: Secondary | ICD-10-CM | POA: Diagnosis not present

## 2022-11-13 DIAGNOSIS — M129 Arthropathy, unspecified: Secondary | ICD-10-CM | POA: Diagnosis not present

## 2022-11-13 DIAGNOSIS — E559 Vitamin D deficiency, unspecified: Secondary | ICD-10-CM | POA: Diagnosis not present

## 2022-11-17 ENCOUNTER — Encounter: Payer: Self-pay | Admitting: Neurology

## 2022-11-17 ENCOUNTER — Ambulatory Visit (INDEPENDENT_AMBULATORY_CARE_PROVIDER_SITE_OTHER): Payer: Medicare Other | Admitting: Neurology

## 2022-11-17 VITALS — BP 125/85 | HR 99 | Ht 62.0 in | Wt 225.3 lb

## 2022-11-17 DIAGNOSIS — G44229 Chronic tension-type headache, not intractable: Secondary | ICD-10-CM | POA: Diagnosis not present

## 2022-11-17 MED ORDER — AMITRIPTYLINE HCL 10 MG PO TABS: 10.0000 mg | ORAL_TABLET | Freq: Every day | ORAL | 6 refills | Status: AC

## 2022-11-17 NOTE — Patient Instructions (Signed)
Restart Elavil 10 mg nightly  Continue with Tylenol/Ibuprofen as needed for the headaches  Increase fluid intake  Follow up in 6 months or sooner if worse

## 2022-11-17 NOTE — Progress Notes (Signed)
[facility]  PATIENT: Michelle Giles DOB: 1986-06-25  REQUESTING CLINICIAN: Trey Sailors, PA HISTORY FROM: Patient  REASON FOR VISIT: Headaches/Dizziness    HISTORICAL  CHIEF COMPLAINT:  Chief Complaint  Patient presents with   Follow-up    Rm 13 alone here for 3 month f/u pt reports overall doing ok. Headaches still present feels like stress plays a role in her symptoms.    INTERVAL HISTORY 11/17/22 Patient presents today for follow-up, she is alone.  Since last visit she was initially doing well when taking the Elavil but stopped taking Elavil then the headaches returned.  She stated that the headaches are brought on by stress, currently she is undergoing a lot of stress including taking care of her kids, going to school and working,  she reported being very overwhelmed. With the headaches, she does take over the counter medication, including Tylenol, Ibuprofen.    HISTORY OF PRESENT ILLNESS:  This is a 36 year old woman past medical history of asthma who is presenting with complaint of headaches.  Patient reports bifrontal headaches every other day lasting a few minutes but they can recur throughout the day.  She reports sometimes headache will wake her up from sleep.  She has sensitivity to light but no nausea, no vomiting associated with the headache.  She feels that sometimes headaches are worsened by stress or after smoking cigarettes.   She is very concerned about her headaches because she is losing friends and family members due to brain aneurysm rupture. Patient is also complaining of dizziness described as lightheadedness that is intermittent.  When she has the dizziness it is only for few seconds and dizziness will improve.  No falls, no nausea no vomiting associated with the dizziness. She reports 1 month ago she had a syncopal episode for 5-minutes, denies any injury from that episode.  She reports she was at work, felt dizzy and next thing she  knows people around her asking if she is okay.  She did not present to the ED after that episode. Patient reported she is a smoker and she is trying to quit.   OTHER MEDICAL CONDITIONS: Asthma    REVIEW OF SYSTEMS: Full 14 system review of systems performed and negative with exception of: as noted in the HPI   ALLERGIES: Allergies  Allergen Reactions   Penicillins Itching    HOME MEDICATIONS: Outpatient Medications Prior to Visit  Medication Sig Dispense Refill   ALBUTEROL IN Inhale into the lungs.     meloxicam (MOBIC) 15 MG tablet Take 15 mg by mouth daily.     omeprazole (PRILOSEC) 20 MG capsule Take 1 capsule (20 mg total) by mouth daily. 30 capsule 0   ondansetron (ZOFRAN) 4 MG tablet Take 1 tablet (4 mg total) by mouth every 6 (six) hours as needed for nausea or vomiting. 12 tablet 0   amitriptyline (ELAVIL) 10 MG tablet Take 1 tablet (10 mg total) by mouth at bedtime. 30 tablet 3   potassium chloride 20 MEQ TBCR Take 20 mEq by mouth daily. 10 tablet 0   valACYclovir (VALTREX) 1000 MG tablet Take 1 tablet (1,000 mg total) by mouth 3 (three) times daily. 21 tablet 0   No facility-administered medications prior to visit.    PAST MEDICAL HISTORY: Past Medical History:  Diagnosis Date   Asthma     PAST SURGICAL HISTORY: History reviewed. No pertinent surgical history.  FAMILY HISTORY: History reviewed. No pertinent family history.  SOCIAL HISTORY: Social History  Socioeconomic History   Marital status: Single    Spouse name: Not on file   Number of children: Not on file   Years of education: Not on file   Highest education level: Not on file  Occupational History   Not on file  Tobacco Use   Smoking status: Former    Types: Cigarettes   Smokeless tobacco: Never   Tobacco comments:    preg  Vaping Use   Vaping Use: Former  Substance and Sexual Activity   Alcohol use: Not Currently    Comment: preg   Drug use: No   Sexual activity: Not on file  Other  Topics Concern   Not on file  Social History Narrative   ** Merged History Encounter **       Social Determinants of Health   Financial Resource Strain: Not on file  Food Insecurity: Not on file  Transportation Needs: Not on file  Physical Activity: Not on file  Stress: Not on file  Social Connections: Not on file  Intimate Partner Violence: Not on file   PHYSICAL EXAM  GENERAL EXAM/CONSTITUTIONAL: Vitals:  Vitals:   11/17/22 1110  BP: 125/85  Pulse: 99  Weight: 225 lb 5 oz (102.2 kg)  Height: '5\' 2"'$  (1.575 m)   Body mass index is 41.21 kg/m. Wt Readings from Last 3 Encounters:  11/17/22 225 lb 5 oz (102.2 kg)  04/30/22 248 lb (112.5 kg)  12/09/18 224 lb 6.9 oz (101.8 kg)   Patient is in no distress; well developed, nourished and groomed; neck is supple  EYES: Pupils round and reactive to light, Visual fields full to confrontation, Extraocular movements intacts,   MUSCULOSKELETAL: Gait, strength, tone, movements noted in Neurologic exam below  NEUROLOGIC: MENTAL STATUS:      No data to display         awake, alert, oriented to person, place and time recent and remote memory intact normal attention and concentration language fluent, comprehension intact, naming intact fund of knowledge appropriate  CRANIAL NERVE:  2nd, 3rd, 4th, 6th - pupils equal and reactive to light, visual fields full to confrontation, extraocular muscles intact, no nystagmus 5th - facial sensation symmetric 7th - facial strength symmetric 8th - hearing intact 9th - palate elevates symmetrically, uvula midline 11th - shoulder shrug symmetric 12th - tongue protrusion midline  MOTOR:  normal bulk and tone, full strength in the BUE, BLE  COORDINATION:  finger-nose-finger, fine finger movements normal   GAIT/STATION:  normal   DIAGNOSTIC DATA (LABS, IMAGING, TESTING) - I reviewed patient records, labs, notes, testing and imaging myself where available.  Lab Results   Component Value Date   WBC 12.1 (H) 11/24/2016   HGB 9.1 (L) 11/24/2016   HCT 26.8 (L) 11/24/2016   MCV 75.1 (L) 11/24/2016   PLT 222 11/24/2016      Component Value Date/Time   NA 140 11/24/2016 0156   K 2.7 (LL) 11/24/2016 0156   CL 110 11/24/2016 0156   CO2 22 11/24/2016 0156   GLUCOSE 96 11/24/2016 0156   BUN 5 (L) 11/24/2016 0156   CREATININE 0.60 11/24/2016 0156   CALCIUM 8.0 (L) 11/24/2016 0156   PROT 6.5 11/24/2016 0156   ALBUMIN 2.8 (L) 11/24/2016 0156   AST 17 11/24/2016 0156   ALT 11 (L) 11/24/2016 0156   ALKPHOS 73 11/24/2016 0156   BILITOT 0.6 11/24/2016 0156   GFRNONAA >60 11/24/2016 0156   GFRAA >60 11/24/2016 0156   No results found for: "CHOL", "  HDL", "LDLCALC", "LDLDIRECT", "TRIG", "CHOLHDL" No results found for: "HGBA1C" No results found for: "VITAMINB12" No results found for: "TSH"   MRI BRAIN 06/08/22 1.  The pituitary gland is thinned within an enlarged sella turcica consistent with a "empty sella".  Although this could be incidental, this finding can also be seen with elevated intracranial pressure.  Consider further evaluation if clinically indicated. 2.  If you scattered punctate T2/FLAIR hyperintense foci in the subcortical white matter.  This is a nonspecific finding.  And most likely represents sequela of chronic migraine or minimal chronic microvascular ischemic change.  None of the foci appear to be acute. 3.  No acute findings.   ASSESSMENT AND PLAN  36 y.o. year old female with history of asthma who is presenting for follow up for her headaches. Initially she was doing well when she was compliant with the Elavil but she discontinue it and the headaches returned.  Advised her to restart Elavil nightly, she can take Tylenol/Ibuprofen or Excedrin headaches as needed. He voices understanding. Follow up in 6 months or sooner if worse.    1. Chronic tension-type headache, not intractable     Patient Instructions  Restart Elavil 10 mg nightly   Continue with Tylenol/Ibuprofen as needed for the headaches  Increase fluid intake  Follow up in 6 months or sooner if worse   No orders of the defined types were placed in this encounter.   Meds ordered this encounter  Medications   amitriptyline (ELAVIL) 10 MG tablet    Sig: Take 1 tablet (10 mg total) by mouth at bedtime.    Dispense:  30 tablet    Refill:  6    Return in about 6 months (around 05/18/2023).  I have spent a total of 30 minutes dedicated to this patient today, preparing to see patient, performing a medically appropriate examination and evaluation, ordering tests and/or medications and procedures, and counseling and educating the patient/family/caregiver; independently interpreting result and communicating results to the family/patient/caregiver; and documenting clinical information in the electronic medical record.   Alric Ran, MD 11/17/2022, 11:58 AM  [facility] [address], [place], [place] [address] [phone]

## 2022-11-19 DIAGNOSIS — Z9189 Other specified personal risk factors, not elsewhere classified: Secondary | ICD-10-CM | POA: Diagnosis not present

## 2022-11-19 DIAGNOSIS — J4520 Mild intermittent asthma, uncomplicated: Secondary | ICD-10-CM | POA: Diagnosis not present

## 2022-11-19 DIAGNOSIS — R5383 Other fatigue: Secondary | ICD-10-CM | POA: Diagnosis not present

## 2022-11-19 DIAGNOSIS — N3000 Acute cystitis without hematuria: Secondary | ICD-10-CM | POA: Diagnosis not present

## 2022-11-19 DIAGNOSIS — K219 Gastro-esophageal reflux disease without esophagitis: Secondary | ICD-10-CM | POA: Diagnosis not present

## 2022-11-19 DIAGNOSIS — J302 Other seasonal allergic rhinitis: Secondary | ICD-10-CM | POA: Diagnosis not present

## 2022-11-19 DIAGNOSIS — E782 Mixed hyperlipidemia: Secondary | ICD-10-CM | POA: Diagnosis not present

## 2022-11-19 DIAGNOSIS — Z720 Tobacco use: Secondary | ICD-10-CM | POA: Diagnosis not present

## 2022-11-25 DIAGNOSIS — N3001 Acute cystitis with hematuria: Secondary | ICD-10-CM | POA: Diagnosis not present

## 2022-11-25 DIAGNOSIS — E782 Mixed hyperlipidemia: Secondary | ICD-10-CM | POA: Diagnosis not present

## 2022-11-25 DIAGNOSIS — J4520 Mild intermittent asthma, uncomplicated: Secondary | ICD-10-CM | POA: Diagnosis not present

## 2022-11-25 DIAGNOSIS — K219 Gastro-esophageal reflux disease without esophagitis: Secondary | ICD-10-CM | POA: Diagnosis not present

## 2022-11-25 DIAGNOSIS — J302 Other seasonal allergic rhinitis: Secondary | ICD-10-CM | POA: Diagnosis not present

## 2022-11-25 DIAGNOSIS — Z720 Tobacco use: Secondary | ICD-10-CM | POA: Diagnosis not present

## 2022-12-16 DIAGNOSIS — Z720 Tobacco use: Secondary | ICD-10-CM | POA: Diagnosis not present

## 2022-12-16 DIAGNOSIS — J302 Other seasonal allergic rhinitis: Secondary | ICD-10-CM | POA: Diagnosis not present

## 2022-12-16 DIAGNOSIS — E782 Mixed hyperlipidemia: Secondary | ICD-10-CM | POA: Diagnosis not present

## 2022-12-16 DIAGNOSIS — L659 Nonscarring hair loss, unspecified: Secondary | ICD-10-CM | POA: Diagnosis not present

## 2022-12-16 DIAGNOSIS — K219 Gastro-esophageal reflux disease without esophagitis: Secondary | ICD-10-CM | POA: Diagnosis not present

## 2022-12-16 DIAGNOSIS — J4520 Mild intermittent asthma, uncomplicated: Secondary | ICD-10-CM | POA: Diagnosis not present

## 2023-01-01 DIAGNOSIS — R5383 Other fatigue: Secondary | ICD-10-CM | POA: Diagnosis not present

## 2023-01-01 DIAGNOSIS — Z79899 Other long term (current) drug therapy: Secondary | ICD-10-CM | POA: Diagnosis not present

## 2023-01-01 DIAGNOSIS — E559 Vitamin D deficiency, unspecified: Secondary | ICD-10-CM | POA: Diagnosis not present

## 2023-01-01 DIAGNOSIS — M2550 Pain in unspecified joint: Secondary | ICD-10-CM | POA: Diagnosis not present

## 2023-01-01 DIAGNOSIS — E7800 Pure hypercholesterolemia, unspecified: Secondary | ICD-10-CM | POA: Diagnosis not present

## 2023-01-01 DIAGNOSIS — I10 Essential (primary) hypertension: Secondary | ICD-10-CM | POA: Diagnosis not present

## 2023-01-01 DIAGNOSIS — M129 Arthropathy, unspecified: Secondary | ICD-10-CM | POA: Diagnosis not present

## 2023-01-01 DIAGNOSIS — D539 Nutritional anemia, unspecified: Secondary | ICD-10-CM | POA: Diagnosis not present

## 2023-01-09 DIAGNOSIS — Z1159 Encounter for screening for other viral diseases: Secondary | ICD-10-CM | POA: Diagnosis not present

## 2023-01-09 DIAGNOSIS — B9689 Other specified bacterial agents as the cause of diseases classified elsewhere: Secondary | ICD-10-CM | POA: Diagnosis not present

## 2023-01-09 DIAGNOSIS — B3731 Acute candidiasis of vulva and vagina: Secondary | ICD-10-CM | POA: Diagnosis not present

## 2023-02-01 DIAGNOSIS — M129 Arthropathy, unspecified: Secondary | ICD-10-CM | POA: Diagnosis not present

## 2023-02-01 DIAGNOSIS — M2550 Pain in unspecified joint: Secondary | ICD-10-CM | POA: Diagnosis not present

## 2023-02-01 DIAGNOSIS — I10 Essential (primary) hypertension: Secondary | ICD-10-CM | POA: Diagnosis not present

## 2023-03-01 DIAGNOSIS — I10 Essential (primary) hypertension: Secondary | ICD-10-CM | POA: Diagnosis not present

## 2023-03-01 DIAGNOSIS — M2550 Pain in unspecified joint: Secondary | ICD-10-CM | POA: Diagnosis not present

## 2023-03-01 DIAGNOSIS — M129 Arthropathy, unspecified: Secondary | ICD-10-CM | POA: Diagnosis not present

## 2023-05-18 ENCOUNTER — Ambulatory Visit: Payer: 59 | Admitting: Neurology

## 2023-05-18 ENCOUNTER — Encounter: Payer: Self-pay | Admitting: Neurology

## 2023-10-16 ENCOUNTER — Encounter (HOSPITAL_BASED_OUTPATIENT_CLINIC_OR_DEPARTMENT_OTHER): Payer: Self-pay | Admitting: Emergency Medicine

## 2023-10-16 ENCOUNTER — Other Ambulatory Visit: Payer: Self-pay

## 2023-10-16 ENCOUNTER — Emergency Department (HOSPITAL_BASED_OUTPATIENT_CLINIC_OR_DEPARTMENT_OTHER)
Admission: EM | Admit: 2023-10-16 | Discharge: 2023-10-16 | Disposition: A | Discharge status: Home / Self Care | Payer: 59 | Attending: Emergency Medicine | Admitting: Emergency Medicine

## 2023-10-16 DIAGNOSIS — Z87891 Personal history of nicotine dependence: Secondary | ICD-10-CM | POA: Diagnosis not present

## 2023-10-16 DIAGNOSIS — R0981 Nasal congestion: Secondary | ICD-10-CM

## 2023-10-16 DIAGNOSIS — Z20822 Contact with and (suspected) exposure to covid-19: Secondary | ICD-10-CM | POA: Diagnosis not present

## 2023-10-16 DIAGNOSIS — J069 Acute upper respiratory infection, unspecified: Secondary | ICD-10-CM | POA: Insufficient documentation

## 2023-10-16 DIAGNOSIS — R059 Cough, unspecified: Secondary | ICD-10-CM | POA: Diagnosis present

## 2023-10-16 DIAGNOSIS — J029 Acute pharyngitis, unspecified: Secondary | ICD-10-CM

## 2023-10-16 DIAGNOSIS — R519 Headache, unspecified: Secondary | ICD-10-CM

## 2023-10-16 DIAGNOSIS — B9789 Other viral agents as the cause of diseases classified elsewhere: Secondary | ICD-10-CM | POA: Diagnosis not present

## 2023-10-16 LAB — RESP PANEL BY RT-PCR (RSV, FLU A&B, COVID)  RVPGX2
Influenza A by PCR: NEGATIVE
Influenza B by PCR: NEGATIVE
Resp Syncytial Virus by PCR: NEGATIVE
SARS Coronavirus 2 by RT PCR: NEGATIVE

## 2023-10-16 MED ORDER — LIDOCAINE 5 % EX PTCH: 1.0000 | MEDICATED_PATCH | CUTANEOUS | 0 refills | Status: AC

## 2023-10-16 MED ORDER — IBUPROFEN 600 MG PO TABS: 600.0000 mg | ORAL_TABLET | Freq: Four times a day (QID) | ORAL | 0 refills | Status: DC | PRN

## 2023-10-16 MED ORDER — CETIRIZINE-PSEUDOEPHEDRINE ER 5-120 MG PO TB12: 1.0000 | ORAL_TABLET | Freq: Every day | ORAL | 0 refills | Status: DC | PRN

## 2023-10-16 MED ORDER — DIPHENHYDRAMINE HCL 50 MG/ML IJ SOLN
25.0000 mg | Freq: Once | INTRAMUSCULAR | Status: AC
  Administered 2023-10-16: 25 mg via INTRAVENOUS
  Filled 2023-10-16: qty 1

## 2023-10-16 MED ORDER — METOCLOPRAMIDE HCL 5 MG/ML IJ SOLN
10.0000 mg | Freq: Once | INTRAMUSCULAR | Status: AC
  Administered 2023-10-16: 10 mg via INTRAVENOUS
  Filled 2023-10-16: qty 2

## 2023-10-16 MED ORDER — TRIAMCINOLONE ACETONIDE 55 MCG/ACT NA AERO: 2.0000 | INHALATION_SPRAY | Freq: Every day | NASAL | 12 refills | Status: AC

## 2023-10-16 MED ORDER — KETOROLAC TROMETHAMINE 15 MG/ML IJ SOLN
15.0000 mg | Freq: Once | INTRAMUSCULAR | Status: AC
  Administered 2023-10-16: 15 mg via INTRAVENOUS
  Filled 2023-10-16: qty 1

## 2023-10-16 MED ORDER — LIDOCAINE 5 % EX PTCH
1.0000 | MEDICATED_PATCH | CUTANEOUS | Status: DC
  Administered 2023-10-16: 1 via TRANSDERMAL
  Filled 2023-10-16: qty 1

## 2023-10-16 MED ORDER — BENZONATATE 100 MG PO CAPS: 100.0000 mg | ORAL_CAPSULE | Freq: Three times a day (TID) | ORAL | 0 refills | Status: DC | PRN

## 2023-10-16 NOTE — Discharge Instructions (Signed)
As discussed, suspect your symptoms are likely secondary to upper respiratory viral illness.  You tested negative for COVID, flu, RSV.  Recommend treatment of symptoms at home with allergy medicine such as Zyrtec/creatinine/Allegra, nasal steroid spray such as Nasacort/Flonase.  That should help your nasal congestion as well as your facial pressure.  Will also send in cough medicine to take as needed for excessive/persistent coughing.  Will send in ibuprofen to take as needed for headache.  Please do not hesitate to return to emergency department for worrisome signs and symptoms we discussed become apparent.

## 2023-10-16 NOTE — ED Triage Notes (Signed)
Pt c/o head and neck pain x 3d, not responding to OTC meds

## 2023-10-16 NOTE — ED Provider Notes (Signed)
[facility] Provider Note   CSN: [id number] Arrival date & time: 10/16/23  1411     History  Chief Complaint  Patient presents with   Headache    Michelle Giles is a 37 y.o. female.   Headache   37 year old female presents emergency department with complaints of cough, nasal congestion, sore throat, body aches, generalized fatigue, headache.  Patient states that she began with symptoms 2 to 3 days ago.  Has been taking Excedrin as well as Tylenol for headache without significant improvement.  Reports history of headache and has follow-up with neurology in the outpatient setting but has since lost touch due to her insurance change.  Denies any visual disturbance, gait abnormality, slurred speech, facial droop, weakness/sensory deficits in upper extremities.  Does report sensitive to light as well as sound.  Reports some left-sided neck pain that has been present for the past couple of days.  Denies any abdominal pain, nausea vomiting urinary symptoms, change in bowel habits.  Past medical history significant for tension headache with, GERD,  Home Medications Prior to Admission medications   Medication Sig Start Date End Date Taking? Authorizing Provider  benzonatate (TESSALON) 100 MG capsule Take 1 capsule (100 mg total) by mouth 3 (three) times daily as needed for cough. 10/16/23  Yes Sherian Maroon A, PA  cetirizine-pseudoephedrine (ZYRTEC-D) 5-120 MG tablet Take 1 tablet by mouth daily as needed for allergies or rhinitis. 10/16/23  Yes Sherian Maroon A, PA  ibuprofen (ADVIL) 600 MG tablet Take 1 tablet (600 mg total) by mouth every 6 (six) hours as needed. 10/16/23  Yes Sherian Maroon A, PA  lidocaine (LIDODERM) 5 % Place 1 patch onto the skin daily. Remove & Discard patch within 12 hours or as directed by MD 10/16/23  Yes Sherian Maroon A, PA  triamcinolone (NASACORT) 55 MCG/ACT AERO nasal inhaler Place 2 sprays into the nose  daily. 10/16/23  Yes Sherian Maroon A, PA  ALBUTEROL IN Inhale into the lungs.    [provider]  amitriptyline (ELAVIL) 10 MG tablet Take 1 tablet (10 mg total) by mouth at bedtime. 11/17/22   Windell Norfolk, MD  meloxicam (MOBIC) 15 MG tablet Take 15 mg by mouth daily. 10/15/22   [provider]  omeprazole (PRILOSEC) 20 MG capsule Take 1 capsule (20 mg total) by mouth daily. 11/24/16   Horton, Mayer Masker, MD  ondansetron (ZOFRAN) 4 MG tablet Take 1 tablet (4 mg total) by mouth every 6 (six) hours as needed for nausea or vomiting. 12/02/17   Dione Booze, MD      Allergies    Penicillins    Review of Systems   Review of Systems  Neurological:  Positive for headaches.  All other systems reviewed and are negative.   Physical Exam Updated Vital Signs BP (!) 150/90 (BP Location: Left Arm)   Pulse 84   Temp 98.4 F (36.9 C) (Oral)   Resp 17   Ht 5\' 2"  (1.575 m)   Wt 109.3 kg   LMP 10/11/2023   SpO2 99%   BMI 44.08 kg/m  Physical Exam Vitals and nursing note reviewed.  Constitutional:      General: She is not in acute distress.    Appearance: She is well-developed.  HENT:     Head: Normocephalic and atraumatic.     Right Ear: Tympanic membrane normal.     Left Ear: Tympanic membrane normal.     Nose: Congestion and rhinorrhea present.  Mouth/Throat:     Comments: Mild posterior pharyngeal erythema.  Uvula midline rise symmetric with phonation.  No sublingual tenderness or swelling.  Tonsils 0+ bilaterally without exudate. Eyes:     Conjunctiva/sclera: Conjunctivae normal.  Cardiovascular:     Rate and Rhythm: Normal rate and regular rhythm.     Heart sounds: No murmur heard. Pulmonary:     Effort: Pulmonary effort is normal. No respiratory distress.     Breath sounds: Wheezing present. No rhonchi or rales.     Comments: Faint expiratory wheeze auscultated bilateral lung fields. Abdominal:     Palpations: Abdomen is soft.     Tenderness: There is  no abdominal tenderness.  Musculoskeletal:        General: No swelling.     Cervical back: Neck supple.     Right lower leg: No edema.     Left lower leg: No edema.     Comments: No midline tenderness of cervical spine.  Paraspinal tenderness noted in the left lower cervical region as well as midline left trapezial ridge.  Patient with full range of motion of neck in flexion/extension, horizontal rotation bilaterally.  Kernig and Brudzinski negative.  Skin:    General: Skin is warm and dry.     Capillary Refill: Capillary refill takes less than 2 seconds.  Neurological:     Mental Status: She is alert.     Comments: Alert and oriented to self, place, time and event.   Speech is fluent, clear without dysarthria or dysphasia.   Strength 5/5 in upper/lower extremities   Sensation intact in upper/lower extremities   Normal gait.  CN I not tested  CN II not tested CN III, IV, VI PERRLA and EOMs intact bilaterally  CN V Intact sensation to sharp and light touch to the face  CN VII facial movements symmetric  CN VIII not tested  CN IX, X no uvula deviation, symmetric rise of soft palate  CN XI 5/5 SCM and trapezius strength bilaterally  CN XII Midline tongue protrusion, symmetric L/R movements     Psychiatric:        Mood and Affect: Mood normal.     ED Results / Procedures / Treatments   Labs (all labs ordered are listed, but only abnormal results are displayed) Labs Reviewed  RESP PANEL BY RT-PCR (RSV, FLU A&B, COVID)  [id number]    EKG None  Radiology No results found.  Procedures Procedures    Medications Ordered in ED Medications  lidocaine (LIDODERM) 5 % 1 patch (1 patch Transdermal Patch Applied 10/16/23 1451)  ketorolac (TORADOL) 15 MG/ML injection 15 mg (15 mg Intravenous Given 10/16/23 1451)  metoCLOPramide (REGLAN) injection 10 mg (10 mg Intravenous Given 10/16/23 1450)  diphenhydrAMINE (BENADRYL) injection 25 mg (25 mg Intravenous Given 10/16/23 1451)     ED Course/ Medical Decision Making/ A&P                                 Medical Decision Making Risk OTC drugs. Prescription drug management.   This patient presents to the ED for concern of cough, nasal congestion, sore throat, headache, this involves an extensive number of treatment options, and is a complaint that carries with it a high risk of complications and morbidity.  The differential diagnosis includes RSV, COVID, influenza, pneumonia, meningitis, migraine/tension/cluster headache, CVA, other   Co morbidities that complicate the patient evaluation  See HPI   Additional history  obtained:  Additional history obtained from EMR External records from outside source obtained and reviewed including hospital records   Lab Tests:  I Ordered, and personally interpreted labs.  The pertinent results include: Respiratory viral panel negative   Imaging Studies ordered:  N/a   Cardiac Monitoring: / EKG:  The patient was maintained on a cardiac monitor.  I personally viewed and interpreted the cardiac monitored which showed an underlying rhythm of: Sinus rhythm   Consultations Obtained:  N/a   Problem List / ED Course / Critical interventions / Medication management  Viral URI, cough, sore throat, nasal congestion, headache I ordered medication including Reglan, Toradol, Benadryl   Reevaluation of the patient after these medicines showed that the patient improved I have reviewed the patients home medicines and have made adjustments as needed   Social Determinants of Health:  Former cigarette use.  Denies illicit drug use.   Test / Admission - Considered:  Viral URI, cough, sore throat, nasal congestion, headache Vitals signs significant for hypertension blood pressure 145/95. Otherwise within normal range and stable throughout visit. Laboratory studies significant for: See above 37 year old female presents emergency department with complaints of nasal  congestion, cough, sore throat, headache began a couple of days ago.  Regarding headache, located in the frontal region without radiation.  Patient is with axillary as well as frontal sinus tenderness to palpation.  Patient with nonfocal neurologic exam.  Treated with migraine cocktail and did note resolution of headache.  Suspect symptoms likely secondary to migraine type headache.  Regarding pain.  Patient with tenderness along lower paraspinal muscles on the left side as well as tenderness along left-sided trapezius muscle.  Full range of motion of neck with clinic in presents with negative.  With lack of fever and nuchal rigidity, low suspicion for meningitis.  Regarding sore throat, no clinical evidence of PTA, ludwig angina, lemierre's disease, retropharyngeal abscess.  Regarding cough, patient without clinical evidence of pneumonia; chest x-ray was foregone. Suspect patient's symptoms are likely secondary to upper respiratory viral infection.  Patient with negative COVID, flu, RSV testing.  Recommend symptomatic therapy as described in AVS.  Close follow-up with primary care recommended for reevaluation.  Treatment plan discussed at length with patient and she acknowledged understanding was agreeable to said plan.  Patient overall well-appearing, afebrile in no acute distress. Worrisome signs and symptoms were discussed with the patient, and the patient acknowledged understanding to return to the ED if noticed. Patient was stable upon discharge.          Final Clinical Impression(s) / ED Diagnoses Final diagnoses:  Viral URI with cough  Nasal congestion  Sore throat  Acute nonintractable headache, unspecified headache type    Rx / DC Orders ED Discharge Orders          Ordered    triamcinolone (NASACORT) 55 MCG/ACT AERO nasal inhaler  Daily        10/16/23 1527    cetirizine-pseudoephedrine (ZYRTEC-D) 5-120 MG tablet  Daily PRN        10/16/23 1527    ibuprofen (ADVIL) 600 MG tablet   Every 6 hours PRN        10/16/23 1527    benzonatate (TESSALON) 100 MG capsule  3 times daily PRN        10/16/23 1527    lidocaine (LIDODERM) 5 %  Every 24 hours        10/16/23 1527  Peter Garter, [place] 10/16/23 1603    Melene Plan, DO 10/17/23 [phone]

## 2023-10-16 NOTE — ED Notes (Signed)
ED Provider at bedside. 

## 2023-10-21 DIAGNOSIS — M7989 Other specified soft tissue disorders: Secondary | ICD-10-CM | POA: Diagnosis not present

## 2023-10-21 DIAGNOSIS — R2243 Localized swelling, mass and lump, lower limb, bilateral: Secondary | ICD-10-CM | POA: Diagnosis not present

## 2023-10-21 DIAGNOSIS — R202 Paresthesia of skin: Secondary | ICD-10-CM | POA: Diagnosis not present

## 2023-10-21 DIAGNOSIS — M79604 Pain in right leg: Secondary | ICD-10-CM | POA: Diagnosis not present

## 2023-10-21 DIAGNOSIS — M79605 Pain in left leg: Secondary | ICD-10-CM | POA: Diagnosis not present

## 2023-11-02 DIAGNOSIS — D509 Iron deficiency anemia, unspecified: Secondary | ICD-10-CM | POA: Diagnosis not present

## 2023-11-02 DIAGNOSIS — Z0000 Encounter for general adult medical examination without abnormal findings: Secondary | ICD-10-CM | POA: Diagnosis not present

## 2023-11-02 DIAGNOSIS — F17210 Nicotine dependence, cigarettes, uncomplicated: Secondary | ICD-10-CM | POA: Diagnosis not present

## 2023-11-02 DIAGNOSIS — Z7689 Persons encountering health services in other specified circumstances: Secondary | ICD-10-CM | POA: Diagnosis not present

## 2024-02-29 NOTE — Progress Notes (Signed)
 CC: Breast and Pelvic Exam    Chief Complaint  Patient presents with  . Annual Exam    PAP: 10/2021 ASCUS HPV+ - Hx of CIN-1 LMP: BCM: BTL     Subjective:   HPI: Michelle Giles is a 38 y.o. y/o [id number] who presents today for annual exam Patient's last menstrual period was 02/07/2024 (approximate).   Patient is currently sexually active with several partners and desires STD testing.  Patient denies any discharge or problems.  Patient also complains of heavy irregular vaginal bleeding with large clots.  She occasionally gets tired and fatigued.  She denies any bladder complaints. She does not have any urgency, frequency, hematuria, and stress urinary incontinence. She denies any bowel complaints. She denies any vaginal discharge. Patient denies any breast pains or masses.  She is sexually active without any problems and denies dyspareunia  or change in desire.  ______________________________________________________________________  Past Surgical History:  Procedure Laterality Date  . CERVICAL BIOPSY  W/ LOOP ELECTRODE EXCISION  06/30/2019   CIN-1  . COLPOSCOPY  05/24/2018   HGSIL  . INDUCED ABORTION     Procedure: INDUCED ABORTION  . LAPAROSCOPIC TUBAL LIGATION Bilateral 08/30/2020   Procedure: LAPAROSCOPIC TUBAL LIGATION;  Surgeon: Karole Deno Blanch, MD;  Location: [facility] MAIN OR;  Service: Obstetrics [facility];  Laterality: Bilateral;  . MOUTH SURGERY      Procedure: MOUTH SURGERY; teeth removed  . TUBAL LIGATION     Procedure: TUBAL LIGATION    Allergies  Allergen Reactions  . Penicillin  Other (See Comments)  . Penicillins Itching    Past Medical History:  Diagnosis Date  . Abnormal Pap smear of cervix   . Acute cystitis without hematuria 11/21/2016  . Anemia   . Anxiety   . ASCUS with positive high risk HPV cervical 03/14/2018   rpt 12/20/2019, 11/10/2021  . Asthma    mild  . Bronchitis   . Chlamydia trachomatis infection 05/27/2013  . Cholelithiasis  06/21/2013  . Depression   . Excessive and frequent menstruation 05/27/2013  . Gastroesophageal reflux disease without esophagitis 08/27/2021  . Gonococcal cervicitis, acute 05/27/2013   Last Assessment & Plan:    Patient's test of cure for gonorrhea is still positive.  She has been retreated with ceftriaxone  and Zithromax  today.  We will repeat her STD testing in 4 weeks, to include trichomonas testing for which she has also been treated.    Michelle Giles Heart palpitations   . 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 HPV in female   . Mental disorder   . Mild dysplasia of cervix (CIN I) 01/31/2014  . Mucous polyp of cervix 05/27/2013  . Panic attacks   . Sepsis due to Escherichia coli (CMD) 11/21/2016  . Trichomonal vulvovaginitis 05/27/2013  . Vaginal high risk human papillomavirus (HPV) DNA test positive 03/05/2014  . Venereal wart 03/15/2014    OB History  Gravida Para Term Preterm AB Living  10 6 6  0 4 6  SAB IAB Ectopic Molar Multiple Live Births  0 4 0 0  6    # Outcome Date GA Lbr Len/2nd Weight Sex Type Anes PTL Lv  10 Term 04/26/19 [redacted]w[redacted]d 02:01 / 00:12 3.066 kg (6 lb 12.2 oz) F NSVD EPI N LIV  9 IAB 05/03/17 [redacted]w[redacted]d         8 IAB 10/01/14 [redacted]w[redacted]d         7 Term 12/01/11 [redacted]w[redacted]d  2.268 kg (5 lb) F NSVD  EPI  LIV  6 Term 01/11/09 [redacted]w[redacted]d  3.175 kg (7 lb) F NSVD EPI  LIV  5 Term 05/24/07 [redacted]w[redacted]d  2.722 kg (6 lb) F NSVD EPI  LIV  4 Term 03/20/05 [redacted]w[redacted]d  2.977 kg (6 lb 9 oz) M NSVD EPI  LIV  3 Term 06/05/02 [redacted]w[redacted]d  2.977 kg (6 lb 9 oz) F NSVD EPI  LIV  2 IAB           1 IAB               Family History  Problem Relation Name Age of Onset  . Hypertension Mother    . Heart disease Mother    . Hypertension Father    . Diabetes Father    . Heart disease Father    . Hypertension Maternal Grandmother    . Diabetes Maternal Grandmother    . Heart disease Maternal Grandmother    . Breast cancer Half-Sister         late 52s  . Colon cancer Neg Hx       Social History   Socioeconomic History  . Marital status: Single    Spouse name: Not on file  . Number of children: Not on file  . Years of education: Not on file  . Highest education level: Not on file  Occupational History  . Not on file  Tobacco Use  . Smoking status: Every Day    Current packs/day: 0.25    Types: Cigarettes  . Smokeless tobacco: Never  Substance and Sexual Activity  . Alcohol use: Yes  . Drug use: Not Currently    Types: Marijuana  . Sexual activity: Yes    Partners: Male    Birth control/protection: Surgical  Other Topics Concern  . Not on file  Social History Narrative  . Not on file   Social Drivers of Health   Food Insecurity: Not on file  Transportation Needs: Not on file  Safety: Not on file  Living Situation: Not on file     Current Outpatient Medications:  .  benzonatate  (TESSALON ) 100 mg capsule, Take 100 mg by mouth 3 (three) times a day as needed for cough., Disp: , Rfl:  .  cetirizine -pseudoePHEDrine  (ZyrTEC ) 5-120 mg ER tablet, Take 1 tablet by mouth daily., Disp: , Rfl:  .  ferrous sulfate 325 mg (65 mg iron) tablet, Take 325 mg by mouth Once Daily., Disp: , Rfl:  .  ibuprofen  (MOTRIN ) 800 mg tablet, Take 1 tablet (800 mg total) by mouth every 8 (eight) hours as needed for mild pain (1-3)., Disp: 60 tablet, Rfl: 0 .  iron-vit C-vit B12-folic acid  (Icar-C Plus) 100-250-25-1 mg-mg-mcg-mg tab, Take  by mouth., Disp: , Rfl:  .  multivitamin cap, Take 1 capsule by mouth Once Daily., Disp: , Rfl:  .  naproxen  (NAPROSYN ) 500 mg tablet, Take 1 tablet (500 mg total) by mouth in the morning and 1 tablet (500 mg total) in the evening. Take with meals. Do all this for 7 days., Disp: 14 tablet, Rfl: 0 .  Ventolin  HFA 90 mcg/actuation inhaler, every 4 (four) hours as needed., Disp: , Rfl: 2    Personal info:   6 kids, working, with FOB     Review Of System:  General: no fever, fatigue, weight or appetite changes Skin: no rashes,  lesions, itching, mole change HEENT: no frequent headaches, hearing changes, sore throat, dental problems, sinus problems   Lymph: no tender or swollen lymph nodes  Breasts:  no lumps, nipple discharge, breast pain Resp: no SOB, cough, wheezing Cardio: no chest pain, palpitations, edema GI: no N/V/D, constipation, anorexia, bloating, reflux, blood in stool, leakage of stool  GU: no frequency, dysuria, flank pain, hematuria, incontinence, retention       GYN: no change in discharge or odor, itching, inter-menstrual or post-coital bleeding, dyspareunia  Musk: no weakness, arthralgia, joint swelling, ROM limitations Endocrine: no polydypsia, polyphagia, polyuria, heat/cold intolerance, hot flashes, dry skin Psych: no depression, anxiety, panic attacks  RECENT LABS: Results for orders placed or performed in visit on 01/07/24  POC TB Skin Test   Collection Time: 01/07/24 10:50 AM  Result Value Ref Range   TB Skin Test Negative    Induration 0 mm     Objective:   PHYSICAL EXAM: Ht 1.575 m (5' 2)   Wt 115 kg (253 lb)   LMP 02/07/2024 (Approximate)   BMI 46.27 kg/m  Constitutional: Well-developed, well-nourished female in no acute distress Neurological: Alert and oriented to person, place, and time, affect appropriate Psychiatric: Mood and affect appropriate Skin: No rashes or lesions, No suspicious lesions, masses, or ulcers.  Neck: Supple without masses. Trachea is midline.Thyroid  is normal size without masses, carotid pulses normal and no bruits Lymphatics: No cervical, axillary, supraclavicular, or inguinal adenopathy noted Respiratory: Clear to auscultation bilaterally. Good air movement with normal work of  breathing. Cardiovascular: Regular rate and rhythm. Extremities grossly normal, nontender with no edema; pulses regular Gastrointestinal: Soft, nontender, nondistended. No masses or hernias appreciated. No hepatosplenomegaly. No fluid wave. No rebound or guarding. Breast  Exam: Appear normal and symmetric without palpable masses, skin changes or nipple inversion.  No discharge, rash, or skin retraction. No palpable lymph nodes. No tenderness, masses, or nipple abnormality Genitourinary:         External Genitalia: Normal female genitalia w/o masses, lesions, rashes    Urethral Meatus: Normal caliber and position    Urethra: Midline, no masses    Bladder: Well-suspended, NT    Vagina: mucosa pink and moist without lesions or ulcers     Cervix: No lesions, normal size and consistency; no cervical motion tenderness     Uterus: Normal size and contour; smooth, mobile, NT Adnexa/Parametria: No masses; no parametrial nodularity; no tenderness Perineum/Anus: No lesions, no hemorids, normal specter tone   Chaperone present during exam.    Assessment/Plan:  Michelle Giles is a 39 y.o. female [phone] who presents for annual exam, history of HPV, mental menorrhagia, dysfunctional uterine bleeding, desires STD testing     Pt will follow in 1 year for a physical exam and knows the importance of getting her routine screening labs and procedures.  Calcium supplementation, STD prevention, importance of avoiding cigarettes, avoiding a high fat diet and exercise were discussed with the patient.  She was told that she needed to do monthly breast exams, get a mammogram yearly starting at age 57, get a lipid profile every 3-5 years, and needed a colonoscopy at 49.  >  Pap smear: pap  Sono with apt  Std testing

## 2024-03-15 DIAGNOSIS — M2142 Flat foot [pes planus] (acquired), left foot: Secondary | ICD-10-CM | POA: Diagnosis not present

## 2024-03-15 DIAGNOSIS — M25572 Pain in left ankle and joints of left foot: Secondary | ICD-10-CM | POA: Diagnosis not present

## 2024-03-15 DIAGNOSIS — M2012 Hallux valgus (acquired), left foot: Secondary | ICD-10-CM | POA: Diagnosis not present

## 2024-03-15 DIAGNOSIS — M79672 Pain in left foot: Secondary | ICD-10-CM | POA: Diagnosis not present

## 2024-04-05 DIAGNOSIS — M898X7 Other specified disorders of bone, ankle and foot: Secondary | ICD-10-CM | POA: Diagnosis not present

## 2024-04-05 DIAGNOSIS — M79672 Pain in left foot: Secondary | ICD-10-CM | POA: Diagnosis not present

## 2024-04-06 NOTE — Progress Notes (Signed)
 CC: Heavy Uterine Bleeding  Subjective:    HPI: Michelle Giles is a 38 y.o. y/o [id number] who presents today for abnormal uterine bleeding.  Pt with irregular cycles lasting 7-12 days that come every 3-6 weeks.  Patient is passing large blood clots and states the bleeding has gotten worse recently.  Also had discharge and history of follow-up for trichomonas.  Patient took her antibiotics.  She denies any bladder complaints. She does not have any urgency, frequency, hematuria, and stress urinary incontinence. She denies any bowel complaints. She denies any vaginal discharge.      Current Outpatient Medications:  .  cetirizine -pseudoePHEDrine  (ZyrTEC ) 5-120 mg ER tablet, Take 1 tablet by mouth daily., Disp: , Rfl:  .  ferrous sulfate 325 mg (65 mg iron) tablet, Take 325 mg by mouth Once Daily., Disp: , Rfl:  .  iron-vit C-vit B12-folic acid  (Icar-C Plus) 100-250-25-1 mg-mg-mcg-mg tab, Take  by mouth., Disp: , Rfl:  .  meloxicam (MOBIC) 15 mg tablet, Take 1 tablet (15 mg total) by mouth daily., Disp: 30 tablet, Rfl: 0 .  multivitamin cap, Take 1 capsule by mouth Once Daily., Disp: , Rfl:  .  Ventolin  HFA 90 mcg/actuation inhaler, every 4 (four) hours as needed., Disp: , Rfl: 2 .  fluconazole (Diflucan) 150 mg tablet, P.o. every 3 days (Patient not taking: Reported on 04/06/2024), Disp: 2 tablet, Rfl: 1 .  metroNIDAZOLE (FLAGYL) 500 mg tablet, Take 1 tablet (500 mg total) by mouth 2 (two) times a day for 7 days., Disp: 14 tablet, Rfl: 0  Allergies  Allergen Reactions  . Penicillin  Other (See Comments)  . Penicillins Itching    Past Medical History:  Diagnosis Date  . Abnormal Pap smear of cervix   . Acute cystitis without hematuria 11/21/2016  . Anemia   . Anxiety   . ASCUS with positive high risk HPV cervical 03/14/2018   rpt 12/20/2019, 11/10/2021  . Asthma (CMD)    mild  . Bronchitis   . Chlamydia trachomatis infection 05/27/2013  . Cholelithiasis 06/21/2013  . Depression    . Excessive and frequent menstruation 05/27/2013  . Gastroesophageal reflux disease without esophagitis 08/27/2021  . Gonococcal cervicitis, acute 05/27/2013   Last Assessment & Plan:    Patient's test of cure for gonorrhea is still positive.  She has been retreated with ceftriaxone  and Zithromax  today.  We will repeat her STD testing in 4 weeks, to include trichomonas testing for which she has also been treated.    SABRA Heart palpitations   . 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 HPV in female   . Mental disorder   . Mild dysplasia of cervix (CIN I) 01/31/2014  . Mucous polyp of cervix 05/27/2013  . Panic attacks   . Sepsis due to Escherichia coli    (CMD) 11/21/2016  . Trichomonal vulvovaginitis 05/27/2013  . Vaginal high risk human papillomavirus (HPV) DNA test positive 03/05/2014  . Venereal wart 03/15/2014    Past Surgical History:  Procedure Laterality Date  . CERVICAL BIOPSY  W/ LOOP ELECTRODE EXCISION  06/30/2019   CIN-1  . COLPOSCOPY  05/24/2018   HGSIL  . INDUCED ABORTION     Procedure: INDUCED ABORTION  . LAPAROSCOPIC TUBAL LIGATION Bilateral 08/30/2020   Procedure: LAPAROSCOPIC TUBAL LIGATION;  Surgeon: Karole Deno Blanch, MD;  Location: [facility] MAIN OR;  Service: Obstetrics [facility];  Laterality: Bilateral;  . MOUTH SURGERY      Procedure: MOUTH SURGERY; teeth  removed  . TUBAL LIGATION     Procedure: TUBAL LIGATION    OB History  Gravida Para Term Preterm AB Living  10 6 6  0 4 6  SAB IAB Ectopic Molar Multiple Live Births  0 4 0 0  6    # Outcome Date GA Lbr Len/2nd Weight Sex Type Anes PTL Lv  10 Term 04/26/19 [redacted]w[redacted]d 02:01 / 00:12 3.066 kg (6 lb 12.2 oz) F NSVD EPI N LIV  9 IAB 05/03/17 [redacted]w[redacted]d         8 IAB 10/01/14 [redacted]w[redacted]d         7 Term 12/01/11 [redacted]w[redacted]d  2.268 kg (5 lb) F NSVD EPI  LIV  6 Term 01/11/09 [redacted]w[redacted]d  3.175 kg (7 lb) F NSVD EPI  LIV  5 Term 05/24/07 [redacted]w[redacted]d  2.722 kg (6 lb) F NSVD EPI  LIV  4 Term 03/20/05 [redacted]w[redacted]d   2.977 kg (6 lb 9 oz) M NSVD EPI  LIV  3 Term 06/05/02 [redacted]w[redacted]d  2.977 kg (6 lb 9 oz) F NSVD EPI  LIV  2 IAB           1 IAB             Social History   Socioeconomic History  . Marital status: Single    Spouse name: Not on file  . Number of children: Not on file  . Years of education: Not on file  . Highest education level: Not on file  Occupational History  . Not on file  Tobacco Use  . Smoking status: Every Day    Current packs/day: 0.25    Types: Cigarettes  . Smokeless tobacco: Never  Substance and Sexual Activity  . Alcohol use: Yes  . Drug use: Not Currently    Types: Marijuana  . Sexual activity: Yes    Partners: Male    Birth control/protection: Surgical  Other Topics Concern  . Not on file  Social History Narrative  . Not on file   Social Drivers of Health   Food Insecurity: Medium Risk (03/15/2024)   Food vital sign   . Within the past 12 months, you worried that your food would run out before you got money to buy more: Sometimes true   . Within the past 12 months, the food you bought just didn't last and you didn't have money to get more: Sometimes true  Transportation Needs: No Transportation Needs (03/15/2024)   Transportation   . In the past 12 months, has lack of reliable transportation kept you from medical appointments, meetings, work or from getting things needed for daily living? : No  Safety: High Risk (04/05/2024)   Safety   . How often does anyone, including family and friends, physically hurt you?: Never   . How often does anyone, including family and friends, insult or talk down to you?: Fairly Often   . How often does anyone, including family and friends, threaten you with harm?: Rarely   . How often does anyone, including family and friends, scream or curse at you?: Rarely  Living Situation: Not on file    Family History  Problem Relation Name Age of Onset  . Hypertension Mother    . Heart disease Mother    . Hypertension Father    .  Diabetes Father    . Heart disease Father    . Hypertension Maternal Grandmother    . Diabetes Maternal Grandmother    . Heart disease Maternal Grandmother    .  Breast cancer Half-Sister         late 20s  . Colon cancer Neg Hx      Review Of Systems   REVIEW OF SYSTEMS: General: no fever, fatigue, weight or appetite changes Skin: no rashes, lesions, itching, mole change HEENT: no frequent headaches, hearing changes, sore throat, dental problems, sinus problems   Lymph: no tender or swollen lymph nodes  Breasts: no lumps, nipple discharge, breast pain Resp: no SOB, cough, wheezing Cardio: no chest pain, palpitations, edema GI: no N/V/D, constipation, anorexia, bloating, reflux, blood in stool, leakage of stool  GU: no frequency, dysuria, flank pain, hematuria, incontinence, retention       Musk: no weakness, arthralgia, joint swelling, ROM limitations Endocrine: no polydypsia, polyphagia, polyuria, heat/cold intolerance, hot flashes, dry skin Psych: no depression, anxiety, panic attacks  Objective:   PHYSICAL EXAM: BP 124/82   Ht 1.575 m (5' 2)   Wt 113 kg (250 lb)   LMP 03/09/2024 (Approximate)   BMI 45.73 kg/m  Constitutional: Well-developed, well-nourished female in no acute distress Neurological: Alert and oriented to person, place, and time, affect appropriate Psychiatric: Mood and affect appropriate Neck: Supple without masses. Trachea is midline.Thyroid  is normal size without masses, carotid pulses normal and no bruits Respiratory: Clear to auscultation bilaterally. Good air movement with normal work of  breathing. Cardiovascular: Regular rate and rhythm. Extremities grossly normal, nontender with no edema; pulses regular Gastrointestinal: Soft, nontender, nondistended. No masses or hernias appreciated. No hepatosplenomegaly. No fluid wave. No rebound or guarding. Genitourinary:         External Genitalia: Normal female genitalia w/o masses, lesions, rashes     Urethral Meatus: Normal caliber and position    Urethra: Midline, no masses    Bladder: Well-suspended, NT    Vagina: mucosa pink and moist without lesions or ulcers     Cervix: No lesions, normal size and consistency; no cervical motion tenderness     Uterus: Normal size and contour; smooth, mobile, NT Adnexa/Parametria: No masses; no parametrial nodularity; no tenderness   Chaperone present during exam.      Assessment/Plan:   38 y.o. y/o [id number] with menometrorrhagia, trichomonas-resolved  Plan:  Wet prepToday is negative.    Patient's ultrasound is normal without any abnormalities.  Pt does not Desire medical treatment or surgical treatment for bleeding.  Patient will follow-up if bleeding gets worse

## 2024-04-29 ENCOUNTER — Emergency Department (HOSPITAL_BASED_OUTPATIENT_CLINIC_OR_DEPARTMENT_OTHER)

## 2024-04-29 ENCOUNTER — Encounter (HOSPITAL_BASED_OUTPATIENT_CLINIC_OR_DEPARTMENT_OTHER): Payer: Self-pay | Admitting: Emergency Medicine

## 2024-04-29 ENCOUNTER — Other Ambulatory Visit: Payer: Self-pay

## 2024-04-29 ENCOUNTER — Emergency Department (HOSPITAL_BASED_OUTPATIENT_CLINIC_OR_DEPARTMENT_OTHER)
Admission: EM | Admit: 2024-04-29 | Discharge: 2024-04-29 | Disposition: A | Discharge status: Home / Self Care | Attending: Emergency Medicine | Admitting: Emergency Medicine

## 2024-04-29 DIAGNOSIS — J45909 Unspecified asthma, uncomplicated: Secondary | ICD-10-CM | POA: Diagnosis not present

## 2024-04-29 DIAGNOSIS — J029 Acute pharyngitis, unspecified: Secondary | ICD-10-CM | POA: Diagnosis not present

## 2024-04-29 DIAGNOSIS — R051 Acute cough: Secondary | ICD-10-CM | POA: Diagnosis not present

## 2024-04-29 DIAGNOSIS — R509 Fever, unspecified: Secondary | ICD-10-CM | POA: Insufficient documentation

## 2024-04-29 DIAGNOSIS — R059 Cough, unspecified: Secondary | ICD-10-CM | POA: Diagnosis not present

## 2024-04-29 LAB — RESP PANEL BY RT-PCR (RSV, FLU A&B, COVID)  RVPGX2
Influenza A by PCR: NEGATIVE
Influenza B by PCR: NEGATIVE
Resp Syncytial Virus by PCR: NEGATIVE
SARS Coronavirus 2 by RT PCR: NEGATIVE

## 2024-04-29 LAB — GROUP A STREP BY PCR: Group A Strep by PCR: NOT DETECTED

## 2024-04-29 MED ORDER — PREDNISONE 50 MG PO TABS
50.0000 mg | ORAL_TABLET | Freq: Once | ORAL | Status: AC
  Administered 2024-04-29: 50 mg via ORAL
  Filled 2024-04-29: qty 1

## 2024-04-29 MED ORDER — ALBUTEROL SULFATE HFA 108 (90 BASE) MCG/ACT IN AERS
1.0000 | INHALATION_SPRAY | Freq: Four times a day (QID) | RESPIRATORY_TRACT | 0 refills | Status: AC | PRN
Start: 2024-04-29 — End: ?

## 2024-04-29 MED ORDER — IBUPROFEN 800 MG PO TABS
800.0000 mg | ORAL_TABLET | Freq: Once | ORAL | Status: AC
  Administered 2024-04-29: 800 mg via ORAL
  Filled 2024-04-29: qty 1

## 2024-04-29 MED ORDER — PREDNISONE 20 MG PO TABS: 20.0000 mg | ORAL_TABLET | Freq: Every day | ORAL | 0 refills | Status: DC

## 2024-04-29 MED ORDER — ACETAMINOPHEN 500 MG PO TABS
1000.0000 mg | ORAL_TABLET | Freq: Once | ORAL | Status: AC
  Administered 2024-04-29: 1000 mg via ORAL
  Filled 2024-04-29: qty 2

## 2024-04-29 MED ORDER — IPRATROPIUM-ALBUTEROL 0.5-2.5 (3) MG/3ML IN SOLN
3.0000 mL | Freq: Once | RESPIRATORY_TRACT | Status: AC
  Administered 2024-04-29: 3 mL via RESPIRATORY_TRACT
  Filled 2024-04-29: qty 3

## 2024-04-29 NOTE — ED Provider Notes (Signed)
 [facility] Provider Note   CSN: [id number] Arrival date & time: 04/29/24  0143     History Chief Complaint  Patient presents with   Cough   Emesis    HPI Michelle Giles is a 38 y.o. female presenting for fever/cough congestion since monday. Asthma history On long-acting controllers for her asthma. Coughing up phlegm.  Vomiting secondary to the degree of coughing today.   Patient's recorded medical, surgical, social, medication list and allergies were reviewed in the Snapshot window as part of the initial history.   Review of Systems   Review of Systems  Constitutional:  Negative for chills and fever.  HENT:  Negative for ear pain and sore throat.   Eyes:  Negative for pain and visual disturbance.  Respiratory:  Positive for cough and shortness of breath.   Cardiovascular:  Negative for chest pain and palpitations.  Gastrointestinal:  Negative for abdominal pain and vomiting.  Genitourinary:  Negative for dysuria and hematuria.  Musculoskeletal:  Negative for arthralgias and back pain.  Skin:  Negative for color change and rash.  Neurological:  Negative for seizures and syncope.  All other systems reviewed and are negative.   Physical Exam Updated Vital Signs BP (!) 140/92   Pulse 97   Temp 98.1 F (36.7 C) (Oral)   Resp 18   LMP 04/19/2024   SpO2 100%  Physical Exam Vitals and nursing note reviewed.  Constitutional:      General: She is not in acute distress.    Appearance: She is well-developed.  HENT:     Head: Normocephalic and atraumatic.  Eyes:     Conjunctiva/sclera: Conjunctivae normal.  Cardiovascular:     Rate and Rhythm: Normal rate and regular rhythm.     Heart sounds: No murmur heard. Pulmonary:     Effort: Pulmonary effort is normal. No respiratory distress.     Breath sounds: Normal breath sounds.  Abdominal:     General: There is no distension.     Palpations: Abdomen is soft.      Tenderness: There is no abdominal tenderness. There is no right CVA tenderness or left CVA tenderness.  Musculoskeletal:        General: No swelling or tenderness. Normal range of motion.     Cervical back: Neck supple.  Skin:    General: Skin is warm and dry.  Neurological:     General: No focal deficit present.     Mental Status: She is alert and oriented to person, place, and time. Mental status is at baseline.     Cranial Nerves: No cranial nerve deficit.      ED Course/ Medical Decision Making/ A&P    Procedures Procedures   Medications Ordered in ED Medications  ipratropium-albuterol  (DUONEB) 0.5-2.5 (3) MG/3ML nebulizer solution 3 mL (3 mLs Nebulization Given 04/29/24 0250)  predniSONE (DELTASONE) tablet 50 mg (50 mg Oral Given 04/29/24 0245)  acetaminophen  (TYLENOL ) tablet 1,000 mg (1,000 mg Oral Given 04/29/24 0245)  ibuprofen  (ADVIL ) tablet 800 mg (800 mg Oral Given 04/29/24 0245)   Medical Decision Making:   Michelle Giles is a 38 y.o. female with a history of asthma, who presented to the ED today with acute SOB. Relatively acute onset in the setting of URI sx. On my initial exam, the pt was SOB and tachypneic. Audible wheezing and grossly decreased breath sounds appreciated.  Reviewed and confirmed nursing documentation for past medical history, family history, social history.  Initial Assessment:   With the patient's presentation of SOB in the above setting, most likely diagnosis is Asthma Exacerbation. Other diagnoses were considered including (but not limited to) CAP, PE, ACS, viral infection, PTX. These are considered less likely due to history of present illness and physical exam findings.   This is most consistent with an acute life/limb threatening illness complicated by underlying chronic conditions.  Initial Plan:  Empiric treatment of patient's symptoms with immediate initiation of inhaled bronchodilators and IV steroids. Evaluation for infectious versus  intrathoracic abnormality with chest x-ray  Patient's Wells score is low and patient does not warrant further objective evaluation for PE based on consistency of presentation of alternative diagnosis.  Objective evaluation as below reviewed   Initial Study Results:   Laboratory  All laboratory results reviewed without evidence of clinically relevant pathology.    Radiology:  All images reviewed independently. Agree with radiology report at this time.   DG Chest Portable 1 View Result Date: 04/29/2024 CLINICAL DATA:  Cough and sore throat for 5 days. EXAM: PORTABLE CHEST 1 VIEW COMPARISON:  02/09/2016 and CT angio chest from 11/24/2016 FINDINGS: The heart size and mediastinal contours are within normal limits. Both lungs are clear. The visualized skeletal structures are unremarkable. IMPRESSION: No active disease. Electronically Signed   By: Kimberley Penman M.D.   On: 04/29/2024 05:14     Final Assessment and Plan:   After initiation of medical therapies, patient is grossly improved and no longer in acute distress.   Disposition:  I have considered need for hospitalization, however, considering all of the above, I believe this patient is stable for discharge at this time.  Patient/family educated about specific return precautions for given chief complaint and symptoms.  Patient/family educated about follow-up with PCP.     Patient/family expressed understanding of return precautions and need for follow-up. Patient spoken to regarding all imaging and laboratory results and appropriate follow up for these results. All education provided in verbal form with additional information in written form. Time was allowed for answering of patient questions. Patient discharged.    Emergency Department Medication Summary:   Medications  ipratropium-albuterol  (DUONEB) 0.5-2.5 (3) MG/3ML nebulizer solution 3 mL (3 mLs Nebulization Given 04/29/24 0250)  predniSONE (DELTASONE) tablet 50 mg (50 mg Oral Given  04/29/24 0245)  acetaminophen  (TYLENOL ) tablet 1,000 mg (1,000 mg Oral Given 04/29/24 0245)  ibuprofen  (ADVIL ) tablet 800 mg (800 mg Oral Given 04/29/24 0245)         Clinical Impression:  1. Acute cough      Discharge  Clinical Impression:  1. Acute cough      Discharge   Final Clinical Impression(s) / ED Diagnoses Final diagnoses:  Acute cough    Rx / DC Orders ED Discharge Orders          Ordered    predniSONE (DELTASONE) 20 MG tablet  Daily        04/29/24 0525    albuterol  (VENTOLIN  HFA) 108 (90 Base) MCG/ACT inhaler  Every 6 hours PRN        04/29/24 0525              Onetha Bile, MD 04/29/24 [phone]

## 2024-04-29 NOTE — ED Triage Notes (Signed)
 Pt presents with productive cough and sore throat since Monday. Today began having vomiting and severe headache with chills.

## 2024-05-01 DIAGNOSIS — F17210 Nicotine dependence, cigarettes, uncomplicated: Secondary | ICD-10-CM | POA: Diagnosis not present

## 2024-05-09 DIAGNOSIS — M84376A Stress fracture, unspecified foot, initial encounter for fracture: Secondary | ICD-10-CM | POA: Diagnosis not present

## 2024-05-29 DIAGNOSIS — L989 Disorder of the skin and subcutaneous tissue, unspecified: Secondary | ICD-10-CM | POA: Diagnosis not present

## 2024-06-22 DIAGNOSIS — J4520 Mild intermittent asthma, uncomplicated: Secondary | ICD-10-CM | POA: Diagnosis not present

## 2024-07-14 DIAGNOSIS — B9689 Other specified bacterial agents as the cause of diseases classified elsewhere: Secondary | ICD-10-CM | POA: Diagnosis not present

## 2024-07-28 DIAGNOSIS — L658 Other specified nonscarring hair loss: Secondary | ICD-10-CM | POA: Diagnosis not present

## 2024-08-03 DIAGNOSIS — E559 Vitamin D deficiency, unspecified: Secondary | ICD-10-CM | POA: Diagnosis not present

## 2024-08-03 DIAGNOSIS — D509 Iron deficiency anemia, unspecified: Secondary | ICD-10-CM | POA: Diagnosis not present

## 2024-08-22 DIAGNOSIS — N644 Mastodynia: Secondary | ICD-10-CM | POA: Diagnosis not present

## 2024-09-02 ENCOUNTER — Other Ambulatory Visit: Payer: Self-pay

## 2024-09-02 ENCOUNTER — Inpatient Hospital Stay (HOSPITAL_BASED_OUTPATIENT_CLINIC_OR_DEPARTMENT_OTHER)
Admission: AD | Admit: 2024-09-02 | Discharge: 2024-09-02 | Disposition: A | Discharge status: Home / Self Care | Attending: Obstetrics and Gynecology | Admitting: Obstetrics and Gynecology

## 2024-09-02 ENCOUNTER — Emergency Department (HOSPITAL_BASED_OUTPATIENT_CLINIC_OR_DEPARTMENT_OTHER)

## 2024-09-02 ENCOUNTER — Encounter (HOSPITAL_BASED_OUTPATIENT_CLINIC_OR_DEPARTMENT_OTHER): Payer: Self-pay | Admitting: Emergency Medicine

## 2024-09-02 DIAGNOSIS — O4690 Antepartum hemorrhage, unspecified, unspecified trimester: Secondary | ICD-10-CM | POA: Diagnosis present

## 2024-09-02 DIAGNOSIS — O00101 Right tubal pregnancy without intrauterine pregnancy: Secondary | ICD-10-CM | POA: Diagnosis not present

## 2024-09-02 DIAGNOSIS — I959 Hypotension, unspecified: Secondary | ICD-10-CM | POA: Diagnosis not present

## 2024-09-02 DIAGNOSIS — Z3A00 Weeks of gestation of pregnancy not specified: Secondary | ICD-10-CM | POA: Diagnosis not present

## 2024-09-02 DIAGNOSIS — Z743 Need for continuous supervision: Secondary | ICD-10-CM | POA: Diagnosis not present

## 2024-09-02 HISTORY — DX: Depression, unspecified: F32.A

## 2024-09-02 LAB — COMPREHENSIVE METABOLIC PANEL WITH GFR
ALT: 14 U/L (ref 0–44)
AST: 19 U/L (ref 15–41)
Albumin: 4 g/dL (ref 3.5–5.0)
Alkaline Phosphatase: 78 U/L (ref 38–126)
Anion gap: 13 (ref 5–15)
BUN: 9 mg/dL (ref 6–20)
CO2: 21 mmol/L — ABNORMAL LOW (ref 22–32)
Calcium: 8.6 mg/dL — ABNORMAL LOW (ref 8.9–10.3)
Chloride: 105 mmol/L (ref 98–111)
Creatinine, Ser: 0.75 mg/dL (ref 0.44–1.00)
GFR, Estimated: >60 mL/min (ref >60)
Glucose, Bld: 98 mg/dL (ref 70–99)
Potassium: 3.6 mmol/L (ref 3.5–5.1)
Sodium: 138 mmol/L (ref 135–145)
Total Bilirubin: 0.3 mg/dL (ref 0.0–1.2)
Total Protein: 6.6 g/dL (ref 6.5–8.1)

## 2024-09-02 LAB — CBC WITH DIFFERENTIAL/PLATELET
Abs Immature Granulocytes: 0.01 K/uL (ref 0.00–0.07)
Basophils Absolute: 0 K/uL (ref 0.0–0.1)
Basophils Relative: 1 %
Eosinophils Absolute: 0.1 K/uL (ref 0.0–0.5)
Eosinophils Relative: 1 %
HCT: 32.4 % — ABNORMAL LOW (ref 36.0–46.0)
Hemoglobin: 10.6 g/dL — ABNORMAL LOW (ref 12.0–15.0)
Immature Granulocytes: 0 %
Lymphocytes Relative: 42 %
Lymphs Abs: 3 K/uL (ref 0.7–4.0)
MCH: 25.2 pg — ABNORMAL LOW (ref 26.0–34.0)
MCHC: 32.7 g/dL (ref 30.0–36.0)
MCV: 77 fL — ABNORMAL LOW (ref 80.0–100.0)
Monocytes Absolute: 0.6 K/uL (ref 0.1–1.0)
Monocytes Relative: 8 %
Neutro Abs: 3.4 K/uL (ref 1.7–7.7)
Neutrophils Relative %: 48 %
Platelets: 250 K/uL (ref 150–400)
RBC: 4.21 MIL/uL (ref 3.87–5.11)
RDW: 15.6 % — ABNORMAL HIGH (ref 11.5–15.5)
WBC: 7.1 K/uL (ref 4.0–10.5)
nRBC: 0 % (ref 0.0–0.2)

## 2024-09-02 LAB — URINALYSIS, ROUTINE W REFLEX MICROSCOPIC
Bilirubin Urine: NEGATIVE
Glucose, UA: NEGATIVE mg/dL
Ketones, ur: NEGATIVE mg/dL
Leukocytes,Ua: NEGATIVE
Nitrite: NEGATIVE
Protein, ur: NEGATIVE mg/dL
Specific Gravity, Urine: 1.02 (ref 1.005–1.030)
pH: 7 (ref 5.0–8.0)

## 2024-09-02 LAB — URINALYSIS, MICROSCOPIC (REFLEX)

## 2024-09-02 LAB — PREGNANCY, URINE: Preg Test, Ur: POSITIVE — AB

## 2024-09-02 LAB — HCG, QUANTITATIVE, PREGNANCY: hCG, Beta Chain, Quant, S: 185 m[IU]/mL — ABNORMAL HIGH (ref <5)

## 2024-09-02 MED ORDER — METHOTREXATE FOR ECTOPIC PREGNANCY
50.0000 mg/m2 | Freq: Once | INTRAMUSCULAR | Status: AC
  Administered 2024-09-02: 110 mg via INTRAMUSCULAR
  Filled 2024-09-02: qty 4.4

## 2024-09-02 MED ORDER — ONDANSETRON HCL 4 MG/2ML IJ SOLN
4.0000 mg | Freq: Once | INTRAMUSCULAR | Status: AC
  Administered 2024-09-02: 4 mg via INTRAVENOUS
  Filled 2024-09-02: qty 2

## 2024-09-02 MED ORDER — MORPHINE SULFATE (PF) 4 MG/ML IV SOLN
4.0000 mg | Freq: Once | INTRAVENOUS | Status: AC
  Administered 2024-09-02: 4 mg via INTRAVENOUS
  Filled 2024-09-02: qty 1

## 2024-09-02 NOTE — Discharge Instructions (Signed)
 The risks of methotrexate  were reviewed including failure requiring repeat dosing or eventual surgery. She understands that methotrexate  involves frequent return visits to monitor lab values and that she remains at risk of ectopic rupture until her beta is less than assay. ?The patient opts to proceed with methotrexate .  She has no history of hepatic or renal dysfunction, has normal BUN/Cr/LFT's/platelets.  She is felt to be reliable for follow-up. Side effects of photosensitivity & GI upset were discussed.  She knows to avoid direct sunlight and abstain from alcohol, NSAIDs and sexual intercourse for two weeks. She was counseled to discontinue any MVI with folic acid . ?She understands to follow up on D4 (9/16) and D7 (9/19) for repeat BHCG and was given the instruction sheet. ?Strict ectopic precautions were reviewed, the patient knows to call with any abdominal pain, vomiting, fainting, or any concerns with her health.  Day 0/1 Day 4 Day 7  Sunday Wednesday Saturday  Monday Thursday Sunday  Tuesday Friday Monday  Wednesday Saturday Tuesday  Thursday Sunday Wednesday  Friday Monday Thursday  Saturday Tuesday Friday

## 2024-09-02 NOTE — ED Triage Notes (Signed)
 Patient c/o vaginal spotting onset a few days ago that has progressed to heavier bleeding with clots. Also c/o RLQ abdominal pain. Describes pain as pressure, worse with urinating. Denies radiation of pain. LMP 2 months ago. Hx of tubal ligation.

## 2024-09-02 NOTE — ED Provider Notes (Signed)
 Michelle Giles Provider Note   CSN: [id number] Arrival date & time: 09/02/24  1628     Patient presents with: Vaginal Bleeding   Michelle Giles is a 38 y.o. female with PMHx asthma who presents to ED concerned for RLQ pain, vaginal bleeding, and passing clots progressing over the past 3-4 days. LMP 2 months ago. Patient had a tubal ligation after her 6th kid was born. Last BM yesterday.  Denies fever, chest pain, dyspnea, vomiting, diarrhea, dysuria, hematuria, hematochezia.  Patient's UPT resulted positive while in waiting room. Patient then starting to state how she has had cravings for pickles recently.     Vaginal Bleeding      Prior to Admission medications   Medication Sig Start Date End Date Taking? Authorizing Provider  albuterol  (VENTOLIN  HFA) 108 (90 Base) MCG/ACT inhaler Inhale 1-2 puffs into the lungs every 6 (six) hours as needed for wheezing or shortness of breath. 04/29/24   Jerral Meth, MD  ALBUTEROL  IN Inhale into the lungs.    [provider]  amitriptyline  (ELAVIL ) 10 MG tablet Take 1 tablet (10 mg total) by mouth at bedtime. 11/17/22   Camara, Amadou, MD  benzonatate  (TESSALON ) 100 MG capsule Take 1 capsule (100 mg total) by mouth 3 (three) times daily as needed for cough. 10/16/23   Silver Wonda LABOR, PA  cetirizine -pseudoephedrine  (ZYRTEC -D) 5-120 MG tablet Take 1 tablet by mouth daily as needed for allergies or rhinitis. 10/16/23   Silver Wonda LABOR, PA  ibuprofen  (ADVIL ) 600 MG tablet Take 1 tablet (600 mg total) by mouth every 6 (six) hours as needed. 10/16/23   Silver Wonda A, PA  lidocaine  (LIDODERM ) 5 % Place 1 patch onto the skin daily. Remove & Discard patch within 12 hours or as directed by MD 10/16/23   Silver Wonda LABOR, PA  meloxicam (MOBIC) 15 MG tablet Take 15 mg by mouth daily. 10/15/22   [provider]  omeprazole  (PRILOSEC) 20 MG capsule Take 1 capsule (20 mg total) by mouth  daily. 11/24/16   Horton, Charmaine FALCON, MD  ondansetron  (ZOFRAN ) 4 MG tablet Take 1 tablet (4 mg total) by mouth every 6 (six) hours as needed for nausea or vomiting. 12/02/17   Raford Lenis, MD  predniSONE  (DELTASONE ) 20 MG tablet Take 1 tablet (20 mg total) by mouth daily. 04/29/24   Jerral Meth, MD  triamcinolone  (NASACORT ) 55 MCG/ACT AERO nasal inhaler Place 2 sprays into the nose daily. 10/16/23   Silver Wonda LABOR, PA    Allergies: Penicillins    Review of Systems  Genitourinary:  Positive for vaginal bleeding.    Updated Vital Signs BP (!) 123/95 (BP Location: Right Arm)   Pulse 74   Temp 98.1 F (36.7 C)   Resp 18   Ht 5' 2 (1.575 m)   Wt 108.9 kg   LMP 06/20/2024 (Approximate)   SpO2 99%   BMI 43.90 kg/m   Physical Exam Vitals and nursing note reviewed.  Constitutional:      General: She is not in acute distress.    Appearance: She is not ill-appearing or toxic-appearing.  HENT:     Head: Normocephalic and atraumatic.     Mouth/Throat:     Mouth: Mucous membranes are moist.  Eyes:     General: No scleral icterus.       Right eye: No discharge.        Left eye: No discharge.     Conjunctiva/sclera: Conjunctivae normal.  Cardiovascular:     Rate and Rhythm: Normal rate and regular rhythm.     Pulses: Normal pulses.     Heart sounds: Normal heart sounds. No murmur heard. Pulmonary:     Effort: Pulmonary effort is normal. No respiratory distress.     Breath sounds: Normal breath sounds. No wheezing, rhonchi or rales.  Abdominal:     General: Abdomen is flat. Bowel sounds are normal. There is no distension.     Palpations: Abdomen is soft. There is no mass.     Tenderness: There is abdominal tenderness.     Comments: RLQ tenderness to palpation  Musculoskeletal:     Right lower leg: No edema.     Left lower leg: No edema.  Skin:    General: Skin is warm and dry.     Findings: No rash.  Neurological:     General: No focal deficit present.     Mental  Status: She is alert and oriented to person, place, and time. Mental status is at baseline.  Psychiatric:        Mood and Affect: Mood normal.        Behavior: Behavior normal.     (all labs ordered are listed, but only abnormal results are displayed) Labs Reviewed  URINALYSIS, ROUTINE W REFLEX MICROSCOPIC - Abnormal; Notable for the following components:      Result Value   Hgb urine dipstick LARGE (*)    All other components within normal limits  PREGNANCY, URINE - Abnormal; Notable for the following components:   Preg Test, Ur POSITIVE (*)    All other components within normal limits  COMPREHENSIVE METABOLIC PANEL WITH GFR - Abnormal; Notable for the following components:   CO2 21 (*)    Calcium 8.6 (*)    All other components within normal limits  CBC WITH DIFFERENTIAL/PLATELET - Abnormal; Notable for the following components:   Hemoglobin 10.6 (*)    HCT 32.4 (*)    MCV 77.0 (*)    MCH 25.2 (*)    RDW 15.6 (*)    All other components within normal limits  HCG, QUANTITATIVE, PREGNANCY - Abnormal; Notable for the following components:   hCG, Beta Chain, Quant, S 185 (*)    All other components within normal limits  URINALYSIS, MICROSCOPIC (REFLEX) - Abnormal; Notable for the following components:   Bacteria, UA MANY (*)    All other components within normal limits  ABO/RH    EKG: None  Radiology: No results found.   .Critical Care  Performed by: Hoy Nidia FALCON, PA-C Authorized by: Hoy Nidia FALCON, PA-C   Critical care provider statement:    Critical care time (minutes):  30   Critical care was necessary to treat or prevent imminent or life-threatening deterioration of the following conditions: Ectopic pregnancy.   Critical care was time spent personally by me on the following activities:  Development of treatment plan with patient or surrogate, discussions with consultants, evaluation of patient's response to treatment, examination of patient, ordering  and review of laboratory studies, ordering and review of radiographic studies, ordering and performing treatments and interventions, pulse oximetry, re-evaluation of patient's condition and review of old charts   Care discussed with: admitting provider      Medications Ordered in the ED  morphine  (PF) 4 MG/ML injection 4 mg (has no administration in time range)  ondansetron  (ZOFRAN ) injection 4 mg (has no administration in time range)  Medical Decision Making Amount and/or Complexity of Data Reviewed Labs: ordered. Radiology: ordered.  Risk Prescription drug management.    This patient presents to the ED for concern of abdominal pain, this involves an extensive number of treatment options, and is a complaint that carries with it a high risk of complications and morbidity.  The differential diagnosis includes gastroenteritis, colitis, small bowel obstruction, appendicitis, cholecystitis, pancreatitis, nephrolithiasis, UTI, pyelonephritis   Co morbidities that complicate the patient evaluation  asthma   Additional history obtained:  Dr. Carles PCP   Problem List / ED Course / Critical interventions / Medication management  Patient presented for RLQ abdominal pain and passing blood clots over the past 3 to 4 days.  Physical exam with RLQ tenderness palpation.  Rest of physical exam reassuring.  Patient afebrile with stable vitals. I Ordered, and personally interpreted labs.  04/2019 rh showing A+.  UA not concerning for infection.  UPT positive.  hCG elevated at 185.  CBC without leukocytosis.  There is mild anemia with hemoglobin at 10.6.  CMP with mildly low CO2 at 21. I ordered imaging studies including Pelvic US . I independently visualized and interpreted imaging and I agree with the radiologist interpretation of possible ectopic pregnancy. I requested consultation with the OB/GYN provider on-call Dr. Nicholaus,  and discussed lab and imaging  findings as well as pertinent plan - they recommend: Transferring to MAU for further evaluation and treatment. Shared all results with patient.  Answered all questions.  Patient agreeable to plan. I have reviewed the patients home medicines and have made adjustments as needed   Social Determinants of Health:   none       Final diagnoses:  Right tubal pregnancy without intrauterine pregnancy    ED Discharge Orders     None          Hoy Nidia FALCON, [place] 09/02/24 2016    Ruthe Cornet, DO 09/02/24 2114

## 2024-09-02 NOTE — MAU Provider Note (Signed)
 S [name] is a 38 y.o. G1P0000 patient who presents to MAU today with complaint of patient is a transfer via [facility] from [facility] ER.  She presented today with concern for right lower quadrant pain, vaginal bleeding, and passing some clots over the last 3 to 4 days.  Patient states her last LMP was 2 months ago and she has status post tubal ligation.  Her workup in the ED included a UPT which resulted as positive and a beta-hCG quant which resulted at 185.  She had an ultrasound completed at [facility] with no evidence of intrauterine pregnancy and concern for a complex cystic structure near the right ovary with suspected ectopic pregnancy of the right ovary.  Patient was then transferred here to [facility] health for management and treatment.  Patient presenting here stable via [facility] in no pain and with stable vital signs.  ROS otherwise negative unless noted in HPI   O BP 112/70 (BP Location: Left Arm)   Pulse 79   Temp 98.2 F (36.8 C) (Oral)   Resp 17   Ht 5' 2 (1.575 m)   Wt 108.9 kg   LMP 06/20/2024 (Approximate)   SpO2 99%   BMI 43.90 kg/m  Physical Exam Vitals and nursing note reviewed.  Constitutional:      General: She is not in acute distress.    Appearance: Normal appearance. She is obese. She is not ill-appearing.  HENT:     Head: Normocephalic.     Nose: Nose normal.     Mouth/Throat:     Mouth: Mucous membranes are moist.  Cardiovascular:     Rate and Rhythm: Normal rate.  Pulmonary:     Effort: Pulmonary effort is normal.  Abdominal:     Palpations: Abdomen is soft.  Musculoskeletal:        General: Normal range of motion.     Cervical back: Normal range of motion.  Skin:    General: Skin is warm.  Neurological:     Mental Status: She is alert and oriented to person, place, and time.  Psychiatric:        Mood and Affect: Mood normal.        Behavior: Behavior normal.    MDM  HIGH   38 year old female with right lower  quadrant pain, vaginal bleeding, status bilateral tubal ligation with a positive pregnancy test and hCG level confirmed at 185 @ [facility] ED and an ultrasound performed consistent with right ectopic pregnancy and no IUP visualized ( Images reviewed )  Discussed management plan with Dr. Zina ([facility] attending) patient is a candidate for methotrexate  and I personally spoke with her about side effects risks benefits.  Patient has chosen to proceed with methotrexate .  Orders placed.     The risks of methotrexate  were reviewed including failure requiring repeat dosing or eventual surgery. She understands that methotrexate  involves frequent return visits to monitor lab values and that she remains at risk of ectopic rupture until her beta is less than assay. ?The patient opts to proceed with methotrexate .  She has no history of hepatic or renal dysfunction, has normal BUN/Cr/LFT's/platelets.  She is felt to be reliable for follow-up. Side effects of photosensitivity & GI upset were discussed.  She knows to avoid direct sunlight and abstain from alcohol, NSAIDs and sexual intercourse for two weeks. She was counseled to discontinue any MVI with folic acid . ?She understands to follow up on D4 (9/16) and D7 (  9/19) for repeat BHCG and was given the instruction sheet. ?Strict ectopic precautions were reviewed, the patient knows to call with any abdominal pain, vomiting, fainting, or any concerns with her health.  Day 0/1 Day 4 Day 7  Sunday Wednesday Saturday  Monday Thursday Sunday  Tuesday Friday Monday  Wednesday Saturday Tuesday  Thursday Sunday Wednesday  Friday Monday Thursday  Saturday Tuesday Friday     Orders Placed This Encounter  Procedures   Critical Care    This order was created via procedure documentation    Standing Status:   Standing    Number of Occurrences:   1   US  OB LESS THAN 14 WEEKS WITH OB TRANSVAGINAL    Standing Status:   Standing    Number of Occurrences:   1     Symptom/Reason for Exam:   Vaginal bleeding [[id number]]   Urinalysis, Routine w reflex microscopic -Urine, Clean Catch    Standing Status:   Standing    Number of Occurrences:   1    Specimen Source:   Urine, Clean Catch [76]   Pregnancy, urine    If UA unable to be obtained, obtain hcg serum qualitative lab test    Standing Status:   Standing    Number of Occurrences:   1   Comprehensive metabolic panel    Standing Status:   Standing    Number of Occurrences:   1   CBC with Differential    Standing Status:   Standing    Number of Occurrences:   1   hCG, quantitative, pregnancy    Standing Status:   Standing    Number of Occurrences:   1   Urinalysis, Microscopic (reflex)    Standing Status:   Standing    Number of Occurrences:   1   Diet NPO time specified    Standing Status:   Standing    Number of Occurrences:   1   Pelvic cart to bedside when patient is in treatment room.    Pelvic cart to bedside (do not place patient in hallway bed).    Standing Status:   Standing    Number of Occurrences:   1   Consult to obstetrics / gynecology  Ectopic    Ectopic    Standing Status:   Standing    Number of Occurrences:   1    Place call to::   oncall OB/Gyn    Reason for Consult:   Admit   ABO/Rh    Standing Status:   Standing    Number of Occurrences:   1      Results for orders placed or performed during the hospital encounter of 09/02/24 (from the past 24 hours)  Urinalysis, Routine w reflex microscopic -Urine, Clean Catch     Status: Abnormal   Collection Time: 09/02/24  4:46 PM  Result Value Ref Range   Color, Urine YELLOW YELLOW   APPearance CLEAR CLEAR   Specific Gravity, Urine 1.020 1.005 - 1.030   pH 7.0 5.0 - 8.0   Glucose, UA NEGATIVE NEGATIVE mg/dL   Hgb urine dipstick LARGE (A) NEGATIVE   Bilirubin Urine NEGATIVE NEGATIVE   Ketones, ur NEGATIVE NEGATIVE mg/dL   Protein, ur NEGATIVE NEGATIVE mg/dL   Nitrite NEGATIVE NEGATIVE   Leukocytes,Ua NEGATIVE NEGATIVE   Pregnancy, urine     Status: Abnormal   Collection Time: 09/02/24  4:46 PM  Result Value Ref Range   Preg Test, Ur POSITIVE (A) NEGATIVE  Comprehensive  metabolic panel     Status: Abnormal   Collection Time: 09/02/24  4:46 PM  Result Value Ref Range   Sodium 138 135 - 145 mmol/L   Potassium 3.6 3.5 - 5.1 mmol/L   Chloride 105 98 - 111 mmol/L   CO2 21 (L) 22 - 32 mmol/L   Glucose, Bld 98 70 - 99 mg/dL   BUN 9 6 - 20 mg/dL   Creatinine, Ser 9.24 0.44 - 1.00 mg/dL   Calcium 8.6 (L) 8.9 - 10.3 mg/dL   Total Protein 6.6 6.5 - 8.1 g/dL   Albumin 4.0 3.5 - 5.0 g/dL   AST 19 15 - 41 U/L   ALT 14 0 - 44 U/L   Alkaline Phosphatase 78 38 - 126 U/L   Total Bilirubin 0.3 0.0 - 1.2 mg/dL   GFR, Estimated >39 >39 mL/min   Anion gap 13 5 - 15  CBC with Differential     Status: Abnormal   Collection Time: 09/02/24  4:46 PM  Result Value Ref Range   WBC 7.1 4.0 - 10.5 K/uL   RBC 4.21 3.87 - 5.11 MIL/uL   Hemoglobin 10.6 (L) 12.0 - 15.0 g/dL   HCT 67.5 (L) 63.9 - 53.9 %   MCV 77.0 (L) 80.0 - 100.0 fL   MCH 25.2 (L) 26.0 - 34.0 pg   MCHC 32.7 30.0 - 36.0 g/dL   RDW 84.3 (H) 88.4 - 84.4 %   Platelets 250 150 - 400 K/uL   nRBC 0.0 0.0 - 0.2 %   Neutrophils Relative % 48 %   Neutro Abs 3.4 1.7 - 7.7 K/uL   Lymphocytes Relative 42 %   Lymphs Abs 3.0 0.7 - 4.0 K/uL   Monocytes Relative 8 %   Monocytes Absolute 0.6 0.1 - 1.0 K/uL   Eosinophils Relative 1 %   Eosinophils Absolute 0.1 0.0 - 0.5 K/uL   Basophils Relative 1 %   Basophils Absolute 0.0 0.0 - 0.1 K/uL   Immature Granulocytes 0 %   Abs Immature Granulocytes 0.01 0.00 - 0.07 K/uL  hCG, quantitative, pregnancy     Status: Abnormal   Collection Time: 09/02/24  4:46 PM  Result Value Ref Range   hCG, Beta Chain, Quant, S 185 (H) <5 mIU/mL  Urinalysis, Microscopic (reflex)     Status: Abnormal   Collection Time: 09/02/24  4:46 PM  Result Value Ref Range   RBC / HPF 0-5 0 - 5 RBC/hpf   WBC, UA 0-5 0 - 5 WBC/hpf   Bacteria, UA MANY  (A) NONE SEEN   Squamous Epithelial / HPF 11-20 0 - 5 /HPF       I have reviewed the patient chart and performed the physical exam . I have ordered & interpreted the lab results and reviewed and interpreted the ultrasound images with OB Attending and agree with radiologist findings Medications ordered as stated above/below.  A/P as described below.  Counseling and education provided and patient agreeable  with plan as described below. Verbalized understanding.    ASSESSMENT Medical screening exam complete  Right tubal pregnancy without intrauterine pregnancy    PLAN  Discharge from MAU in stable condition  Follow up as stated above for repeat HCG levels   See AVS for full description of educational information and instructions provided to the patient at time of discharge  Warning signs for worsening condition that would warrant emergency follow-up discussed Patient may return to MAU as needed   Littie Olam LABOR, NP 09/02/2024  9:45 PM   This chart was dictated using voice recognition software, Dragon. Despite the best efforts of this provider to proofread and correct errors, errors may still occur which can change documentation meaning.

## 2024-09-02 NOTE — ED Notes (Signed)
 [facility] at bedside

## 2024-09-02 NOTE — ED Notes (Signed)
 [facility] called for transport.

## 2024-09-02 NOTE — MAU Note (Signed)
..  Michelle Giles is a 38 y.o. at Unknown here in MAU from [facility]: Denies abdominal pain and has slight vaginal bleeding.   Pain score: 0/10 Vitals:   09/02/24 2030 09/02/24 2135  BP: 105/74   Pulse: 86   Resp:    Temp:    SpO2: 100% 99%

## 2024-09-04 NOTE — Progress Notes (Signed)
 FAMILY MEDICINE - [facility] [place] [address]  September 04, 2024   Subjective:   Chief Complaint  Patient presents with  . Follow-up    [place]     Michelle Giles is a 38 y.o. female presenting for follow-up after discovering she had a tubal pregnancy (in the context of a history of tubal ligation in her past) 2 days ago at an ED visit. She was estimated to be [redacted] weeks pregnant, and after discussing with the ED provider, she was given Methotrexate . She has noted intermittent bleeding and spotting since starting the medication. She also continues to have nausea, vomiting only once, light-headedness, and weakness. There is soreness of her right lower abdomen. She generally feels unwell though is not having fevers, chills, sweats, chest pain or pressure, or palpitations.    Allergies Allergies[1]  Medications Current Medications[2]  Medical history Medical History[3]  Surgical History Surgical History[4]  Social History Social History[5]   Family History Family History[6]   ROS:  Review of Systems  Constitutional:  Positive for fatigue. Negative for chills, diaphoresis and fever.  HENT:  Negative for congestion, rhinorrhea and sore throat.   Eyes:  Negative for visual disturbance.  Respiratory:  Negative for cough and shortness of breath.   Cardiovascular:  Negative for chest pain and palpitations.  Gastrointestinal:  Positive for abdominal pain, nausea and vomiting. Negative for diarrhea.  Genitourinary:  Positive for pelvic pain and vaginal bleeding. Negative for decreased urine volume, difficulty urinating, dysuria and vaginal discharge.  Neurological:  Positive for light-headedness. Negative for dizziness and headaches.    Objective:   Vital Signs Vitals:   09/04/24 1455  BP: 104/76  Pulse: 106  SpO2: 98%  Weight: 113 kg (249 lb)  Height: 1.575 m (5' 2)     Physical Exam Vitals reviewed.  Constitutional:       General: She is not in acute distress.    Appearance: She is obese. She is not ill-appearing.  HENT:     Head: Normocephalic.     Right Ear: External ear normal.     Left Ear: External ear normal.     Nose: No congestion or rhinorrhea.  Eyes:     General: No scleral icterus.       Right eye: No discharge.        Left eye: No discharge.  Cardiovascular:     Rate and Rhythm: Normal rate and regular rhythm.     Pulses: Normal pulses.  Pulmonary:     Effort: Pulmonary effort is normal. No respiratory distress.  Abdominal:     General: Bowel sounds are normal. There is no distension.     Palpations: Abdomen is soft.     Tenderness: There is abdominal tenderness (tenderness to moderate palpation, in the suprapubic area and epigastric region). There is no guarding or rebound.  Musculoskeletal:     Cervical back: No rigidity.  Skin:    General: Skin is warm and dry.     Capillary Refill: Capillary refill takes less than 2 seconds.  Neurological:     Mental Status: She is alert and oriented to person, place, and time.     Gait: Gait normal.  Psychiatric:        Mood and Affect: Mood normal.        Behavior: Behavior normal.        Thought Content: Thought content normal.        Judgment: Judgment normal.  Assessment/Plan:    Problem List Items Addressed This Visit     Right tubal pregnancy without intrauterine pregnancy (CMD) - Primary   Current Assessment & Plan  Given how tender she seems, a stat ultrasound has been ordered as follow-up of the right tubal pregnancy. Zofran  was sent for nausea, and a quantitative HCG serum test completed. Advised she follow the recommendation and go back to the ED tomorrow, for another HCG test. Reviewed alarming signs and symptoms.         After discussing questions and concerns, goals of care, medication side effects (if changes to their medications were made), and adequate follow-up, the patient verbalizes understanding and in  agreement with the above plan. Advised patient to call clinic or return for visit if condition worsens or symptoms recur.  There are no barriers to discussed goal.  Return if symptoms worsen or fail to improve.   This document serves as a record of services personally performed by Dr. Helene Fare.  It was created on their behalf by Greig Ochoa, CMA, a trained medical scribe, and Certified Medical Assistant (CMA). During the course of documenting the history, physical exam and medical decision making, I was functioning as a Stage manager. The creation of this record is the provider's dictation and/or activities during the visit.  Electronically signed by Greig Ochoa, CMA 09/04/2024 2:55 PM    Helene Fare, MD       [1] Allergies Allergen Reactions  . Penicillins Itching  [2] Current Outpatient Medications  Medication Sig Dispense Refill  . budesonide-formoteroL (Symbicort) 160-4.5 mcg/actuation inhaler Inhale 2 puffs in the morning and 2 puffs before bedtime. 1 each 3  . cetirizine -pseudoePHEDrine  (ZyrTEC ) 5-120 mg ER tablet Take 1 tablet by mouth daily.    . ferrous sulfate 325 mg (65 mg iron) tablet Take 325 mg by mouth Once Daily.    . fluconazole (DIFLUCAN) 200 mg tablet 1 tablet every other day till complete 3 tablet 0  . fluocinolone and shower cap 0.01 % oil Apply to affected scalp twice daily as needed, and 24 hours before each shampoo. 118.28 mL 4  . triamcinolone  acetonide (KENALOG ) 0.1 % cream Apply topically 2 (two) times a day. 15 g 0  . Ventolin  HFA 90 mcg/actuation inhaler Inhale 2 puffs every 6 (six) hours as needed for shortness of breath or wheezing. 1 each 2  . ondansetron  (ZOFRAN -ODT) 4 mg disintegrating tablet Dissolve 1 tablet (4 mg total) on tongue every 8 (eight) hours as needed for nausea. 30 tablet 0   No current facility-administered medications for this visit.  [3] Past Medical History: Diagnosis Date  . Abnormal Pap smear of cervix   . Acute cystitis without  hematuria 11/21/2016  . Anemia   . Anxiety   . ASCUS with positive high risk HPV cervical 03/14/2018   rpt 12/20/2019, 11/10/2021  . Asthma (CMD)    mild  . Bronchitis   . Chlamydia trachomatis infection 05/27/2013  . Cholelithiasis 06/21/2013  . Chronic pain 08/22/2024  . Depression   . Excessive and frequent menstruation 05/27/2013  . Gastroesophageal reflux disease without esophagitis 08/27/2021  . Gonococcal cervicitis, acute 05/27/2013  . Heart palpitations   . 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 HPV in female   . Irregular menses 08/22/2024  . Low back pain 08/22/2024  . Mental disorder   . Mild dysplasia of cervix (CIN I) 01/31/2014  . Mucous polyp of cervix 05/27/2013  . Panic  attacks   . Recurrent major depression in remission 08/22/2024  . Refractory migraine with aura 08/22/2024  . Sepsis due to Escherichia coli    (CMD) 11/21/2016  . Tobacco abuse 08/22/2024  . Trichomonal vulvovaginitis 05/27/2013  . Vaginal high risk human papillomavirus (HPV) DNA test positive 03/05/2014  . Venereal wart 03/15/2014  [4] Past Surgical History: Procedure Laterality Date  . CERVICAL BIOPSY  W/ LOOP ELECTRODE EXCISION  06/30/2019   CIN-1  . COLPOSCOPY  05/24/2018   HGSIL  . INDUCED ABORTION    . LAPAROSCOPIC TUBAL LIGATION Bilateral 08/30/2020   [place] Navin Patel, MD  . MOUTH SURGERY      teeth removed?  [5] Social History Tobacco Use  . Smoking status: Every Day    Current packs/day: 0.50    Types: Cigarettes, Cigars  . Smokeless tobacco: Never  Substance Use Topics  . Alcohol use: Yes  . Drug use: Not Currently    Types: Marijuana  [6] Family History Problem Relation Name Age of Onset  . Hypertension Mother Miliana Gangwer   . Heart disease Mother Katori Wirsing   . Psoriasis Father Lynwood Ellen   . Hypertension Father Lynwood Ellen   . Diabetes Father Lynwood Ellen   . Heart disease Father Lynwood Ellen   .  Hypertension Maternal Grandmother Kenneth Pounds   . Diabetes Maternal Grandmother Kenneth Pounds   . Heart disease Maternal Grandmother Kenneth Pounds   . Breast cancer Half-Sister         late 60s  . Colon cancer Neg Hx    . Eczema Neg Hx

## 2024-09-05 ENCOUNTER — Other Ambulatory Visit: Payer: Self-pay

## 2024-09-05 ENCOUNTER — Emergency Department (HOSPITAL_BASED_OUTPATIENT_CLINIC_OR_DEPARTMENT_OTHER): Admission: EM | Admit: 2024-09-05 | Discharge: 2024-09-05 | Discharge status: Absent without leave

## 2024-09-05 NOTE — ED Notes (Addendum)
 Spoke with OB office upstairs regarding lab follow up. Instructed to send pt upstairs so labs could be obtained and followed up. Pt instructed to proceed to [address] for Follow up. States understanding

## 2024-09-06 ENCOUNTER — Emergency Department (HOSPITAL_BASED_OUTPATIENT_CLINIC_OR_DEPARTMENT_OTHER)
Admission: EM | Admit: 2024-09-06 | Discharge: 2024-09-06 | Disposition: A | Discharge status: Home / Self Care | Attending: Emergency Medicine | Admitting: Emergency Medicine

## 2024-09-06 ENCOUNTER — Other Ambulatory Visit

## 2024-09-06 ENCOUNTER — Encounter (HOSPITAL_BASED_OUTPATIENT_CLINIC_OR_DEPARTMENT_OTHER): Payer: Self-pay | Admitting: Emergency Medicine

## 2024-09-06 ENCOUNTER — Other Ambulatory Visit: Payer: Self-pay

## 2024-09-06 ENCOUNTER — Emergency Department (HOSPITAL_BASED_OUTPATIENT_CLINIC_OR_DEPARTMENT_OTHER)

## 2024-09-06 DIAGNOSIS — R109 Unspecified abdominal pain: Secondary | ICD-10-CM | POA: Diagnosis not present

## 2024-09-06 DIAGNOSIS — O009 Unspecified ectopic pregnancy without intrauterine pregnancy: Secondary | ICD-10-CM | POA: Diagnosis not present

## 2024-09-06 DIAGNOSIS — E785 Hyperlipidemia, unspecified: Secondary | ICD-10-CM | POA: Insufficient documentation

## 2024-09-06 DIAGNOSIS — R1031 Right lower quadrant pain: Secondary | ICD-10-CM | POA: Insufficient documentation

## 2024-09-06 DIAGNOSIS — K7689 Other specified diseases of liver: Secondary | ICD-10-CM | POA: Diagnosis not present

## 2024-09-06 DIAGNOSIS — Z9851 Tubal ligation status: Secondary | ICD-10-CM | POA: Insufficient documentation

## 2024-09-06 DIAGNOSIS — R112 Nausea with vomiting, unspecified: Secondary | ICD-10-CM | POA: Diagnosis not present

## 2024-09-06 LAB — CBC WITH DIFFERENTIAL/PLATELET
Abs Immature Granulocytes: 0.02 K/uL (ref 0.00–0.07)
Basophils Absolute: 0 K/uL (ref 0.0–0.1)
Basophils Relative: 0 %
Eosinophils Absolute: 0.1 K/uL (ref 0.0–0.5)
Eosinophils Relative: 1 %
HCT: 33.3 % — ABNORMAL LOW (ref 36.0–46.0)
Hemoglobin: 10.9 g/dL — ABNORMAL LOW (ref 12.0–15.0)
Immature Granulocytes: 0 %
Lymphocytes Relative: 41 %
Lymphs Abs: 3.1 K/uL (ref 0.7–4.0)
MCH: 25.1 pg — ABNORMAL LOW (ref 26.0–34.0)
MCHC: 32.7 g/dL (ref 30.0–36.0)
MCV: 76.7 fL — ABNORMAL LOW (ref 80.0–100.0)
Monocytes Absolute: 0.3 K/uL (ref 0.1–1.0)
Monocytes Relative: 4 %
Neutro Abs: 3.9 K/uL (ref 1.7–7.7)
Neutrophils Relative %: 54 %
Platelets: 254 K/uL (ref 150–400)
RBC: 4.34 MIL/uL (ref 3.87–5.11)
RDW: 15.6 % — ABNORMAL HIGH (ref 11.5–15.5)
WBC: 7.4 K/uL (ref 4.0–10.5)
nRBC: 0 % (ref 0.0–0.2)

## 2024-09-06 LAB — COMPREHENSIVE METABOLIC PANEL WITH GFR
ALT: 25 U/L (ref 0–44)
AST: 26 U/L (ref 15–41)
Albumin: 4.2 g/dL (ref 3.5–5.0)
Alkaline Phosphatase: 82 U/L (ref 38–126)
Anion gap: 11 (ref 5–15)
BUN: 7 mg/dL (ref 6–20)
CO2: 23 mmol/L (ref 22–32)
Calcium: 8.9 mg/dL (ref 8.9–10.3)
Chloride: 105 mmol/L (ref 98–111)
Creatinine, Ser: 0.68 mg/dL (ref 0.44–1.00)
GFR, Estimated: >60 mL/min (ref >60)
Glucose, Bld: 94 mg/dL (ref 70–99)
Potassium: 3.6 mmol/L (ref 3.5–5.1)
Sodium: 139 mmol/L (ref 135–145)
Total Bilirubin: 0.3 mg/dL (ref 0.0–1.2)
Total Protein: 6.8 g/dL (ref 6.5–8.1)

## 2024-09-06 LAB — HCG, QUANTITATIVE, PREGNANCY: hCG, Beta Chain, Quant, S: 67 m[IU]/mL — ABNORMAL HIGH (ref <5)

## 2024-09-06 LAB — LIPASE, BLOOD: Lipase: 25 U/L (ref 11–51)

## 2024-09-06 MED ORDER — SODIUM CHLORIDE 0.9 % IV BOLUS
1000.0000 mL | Freq: Once | INTRAVENOUS | Status: AC
  Administered 2024-09-06: 1000 mL via INTRAVENOUS

## 2024-09-06 MED ORDER — IOHEXOL 300 MG/ML  SOLN
125.0000 mL | Freq: Once | INTRAMUSCULAR | Status: AC | PRN
  Administered 2024-09-06: 125 mL via INTRAVENOUS

## 2024-09-06 NOTE — Discharge Instructions (Signed)
 Take ibuprofen  600 mg every 6 hours as needed for pain.  Follow-up with your GYN in the next few days.

## 2024-09-06 NOTE — ED Triage Notes (Addendum)
 Pt reports she was seen for ectopic pregnancy Sunday, reports she was transported to [facility], pt states she has had pressure, abd pain, emesis, and diarrhea since

## 2024-09-06 NOTE — ED Notes (Signed)
 Patient transported to CT

## 2024-09-06 NOTE — ED Provider Notes (Signed)
 [facility] [place] Provider Note   CSN: [id number] Arrival date & time: 09/06/24  9956     Patient presents with: Abdominal Pain   Michelle Giles is a 38 y.o. female.   Patient is a 38 year old female with history of hyperlipidemia, prior tubal ligation.  Patient diagnosed with ectopic pregnancy several days ago and received methotrexate  at [facility].  Since then, she describes lower abdominal cramping and feeling generally unwell.  Vaginal bleeding stopped 2 days ago.  She does report some nausea and vomiting.  No fevers or chills.       Prior to Admission medications   Medication Sig Start Date End Date Taking? Authorizing Provider  albuterol  (VENTOLIN  HFA) 108 (90 Base) MCG/ACT inhaler Inhale 1-2 puffs into the lungs every 6 (six) hours as needed for wheezing or shortness of breath. 04/29/24   Jerral Meth, MD  ALBUTEROL  IN Inhale into the lungs.    [provider]  amitriptyline  (ELAVIL ) 10 MG tablet Take 1 tablet (10 mg total) by mouth at bedtime. 11/17/22   Camara, Amadou, MD  benzonatate  (TESSALON ) 100 MG capsule Take 1 capsule (100 mg total) by mouth 3 (three) times daily as needed for cough. 10/16/23   Silver Wonda LABOR, PA  cetirizine -pseudoephedrine  (ZYRTEC -D) 5-120 MG tablet Take 1 tablet by mouth daily as needed for allergies or rhinitis. 10/16/23   Silver Wonda LABOR, PA  hydrOXYzine (VISTARIL) 25 MG capsule Take 25 mg by mouth 3 (three) times daily as needed for anxiety.    [provider]  lidocaine  (LIDODERM ) 5 % Place 1 patch onto the skin daily. Remove & Discard patch within 12 hours or as directed by MD 10/16/23   Silver Wonda LABOR, PA  omeprazole  (PRILOSEC) 20 MG capsule Take 1 capsule (20 mg total) by mouth daily. 11/24/16   Horton, Charmaine FALCON, MD  ondansetron  (ZOFRAN ) 4 MG tablet Take 1 tablet (4 mg total) by mouth every 6 (six) hours as needed for nausea or vomiting. 12/02/17   Raford Lenis, MD   predniSONE  (DELTASONE ) 20 MG tablet Take 1 tablet (20 mg total) by mouth daily. 04/29/24   Jerral Meth, MD  sertraline (ZOLOFT) 25 MG tablet Take 25 mg by mouth daily.    [provider]  triamcinolone  (NASACORT ) 55 MCG/ACT AERO nasal inhaler Place 2 sprays into the nose daily. 10/16/23   Silver Wonda LABOR, PA    Allergies: Penicillins    Review of Systems  All other systems reviewed and are negative.   Updated Vital Signs BP 129/79 (BP Location: Right Arm)   Pulse 86   Temp 98.5 F (36.9 C) (Temporal)   Resp 17   Ht 5' 2 (1.575 m)   Wt 108.9 kg   LMP 06/20/2024 (Approximate)   SpO2 100%   BMI 43.90 kg/m   Physical Exam Vitals and nursing note reviewed.  Constitutional:      General: She is not in acute distress.    Appearance: She is well-developed. She is not diaphoretic.  HENT:     Head: Normocephalic and atraumatic.  Cardiovascular:     Rate and Rhythm: Normal rate and regular rhythm.     Heart sounds: No murmur heard.    No friction rub. No gallop.  Pulmonary:     Effort: Pulmonary effort is normal. No respiratory distress.     Breath sounds: Normal breath sounds. No wheezing.  Abdominal:     General: Bowel sounds are normal. There is no distension.  Palpations: Abdomen is soft.     Tenderness: There is abdominal tenderness in the right lower quadrant. There is no right CVA tenderness, left CVA tenderness, guarding or rebound.  Musculoskeletal:        General: Normal range of motion.     Cervical back: Normal range of motion and neck supple.  Skin:    General: Skin is warm and dry.  Neurological:     General: No focal deficit present.     Mental Status: She is alert and oriented to person, place, and time.     (all labs ordered are listed, but only abnormal results are displayed) Labs Reviewed  COMPREHENSIVE METABOLIC PANEL WITH GFR  LIPASE, BLOOD  CBC WITH DIFFERENTIAL/PLATELET  HCG, QUANTITATIVE, PREGNANCY     EKG: None  Radiology: No results found.   Procedures   Medications Ordered in the ED  sodium chloride  0.9 % bolus 1,000 mL (has no administration in time range)                                    Medical Decision Making Amount and/or Complexity of Data Reviewed Labs: ordered. Radiology: ordered.  Risk Prescription drug management.   Patient presenting here with lower abdominal pain after being treated with methotrexate  for an ectopic pregnancy last week.  Patient arrives here with stable vital signs and is afebrile.  Physical examination reveals lower abdominal tenderness, but no peritoneal signs.  Laboratory studies obtained including CBC, CMP, and lipase, all of which are unremarkable.  Beta-hCG is 67 and trending down from prior studies.  CT scan of the abdomen and pelvis showing no acute process.  At this point, nothing appears acute.  I feel as though patient can safely be discharged with outpatient follow-up.     Final diagnoses:  None    ED Discharge Orders     None          Geroldine Berg, MD 09/06/24 [phone]

## 2024-09-07 ENCOUNTER — Telehealth: Payer: Self-pay

## 2024-09-07 NOTE — Telephone Encounter (Signed)
 Patient called to say she missed her hcg appointment on 9/17 because she went into the ED due to pain. She stated that she would like to know what the next steps would be for managing her ectopic pregnancy. Patient was initially instructed to stop taking all of her medications. She is anemic and the ED instructed her to begin taking her medications like normal. She would like to discuss that as well to make sure it is safe.   She just wants to make sure she's being monitored since this is a new experience for her.   Best contact number is [phone]. Patient said she may not answer the phone because she is going into work. Gave verbal permission to leave a voicemail if she doesn't answer.  Routing to clinical team.

## 2024-09-07 NOTE — Telephone Encounter (Signed)
 Spoke with patient.  She is being seen by Dr. Tobie and he is managing her follow up.  Erminio DELENA Rumps, RN

## 2024-09-08 ENCOUNTER — Other Ambulatory Visit: Payer: Self-pay

## 2024-11-23 NOTE — Progress Notes (Signed)
 [address] - AMBULATORY [facility]  - FAMILY MEDICINE [address] [place] [id number]   Patient Name:Michelle Giles  Patient DOB: [birth year]  Assessment and Plan:   1. Viral upper respiratory tract infection (Primary), acute, uncontrolled. - SARS-Cov-2, Flu, and RSV, Qualitative NAAT; Future - benzonatate  (TESSALON ) 100 mg capsule; Take 1-2 capsules (100-200 mg total) by mouth 3 (three) times a day as needed for cough.  Dispense: 30 capsule; Refill: 0 - discussed no current role for abx - push fluids for hydration - vitamin C, D, Zinc supplementation for immune support - tylenol /motrin /flonase prn - saline flushes bid as tolerated - quarantine until results return - advised on red flags and when to rtc vs seek ER care  2. Mild intermittent asthma without complication (CMD), chronic, stable. - Ventolin  HFA 90 mcg/actuation inhaler; Inhale 2 puffs every 6 (six) hours as needed for wheezing or shortness of breath.  Dispense: 18 g; Refill: 0 - Cont as prev prescribed.  Pt understands/agrees to plan above. All medication use/SE/interactions discussed. All questions answered. No barriers to plan of care.   F/u as planned or sooner prn at pt discretion if any new, changing or worsening sx.  This document was created using the aid of voice recognition Scientist, clinical (histocompatibility and immunogenetics).   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 She comes in by herself.  URI- Patient here for c/o headaches, dry cough, loss of voice for 4 days. Clinical course: gradually worsening. Sick contacts? Children at home sick with common cold. Client recently sick. Never diagnosed with anything. 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  Recent travel? No.   She is drinking moderate amounts of fluids.  Evaluation to date: none.  Treatment to date: Ibuprofen , Nyquil, throat spray,  Denies: fever/chills, loss of taste/smell, myalgias, malaise/fatigue/weakness, dyspnea, wheezing, CP, palpitations, rhinorrhea, PND, oropharyngeal erythema, tonsillar exudates, uvular deviation, voice change, GI/GU sx  Pt also requests a refill on the following- Medication: Ventolin  Indication: wheezing/sob  Patient is taking medication as prescribed. There are not any SE noted. Most recent labs 10/2024.  No other complaints.   Past Medical/Surgical History:   Problem List[1] Surgical History[2]  Family History:   Family History[3]  Social History:   Social History[4]  Allergies:   Penicillins  Current Medications:   Current Medications[5]  ROS:   Review of Systems  A 14-point ROS was conducted and negative with exception to HPI above.  Objective:   Vitals:   11/23/24 1337  BP: 125/83  Pulse: 93  Resp: 16  SpO2: 99%  Weight: 112 kg (246 lb)  Height: 1.575 m (5' 2)   Physical Exam  Constitutional: Well-developed and well-nourished. Obese. Sitting comfortably conversing normally. Pleasant.  Eyes: Conjunctivae clear and pink, no pallor, EOM intact. Pupils are equal, round, and reactive to light. Anicteric. Ears, Mouth, Nose:  External and internal auditory canals are normal. Tympanic membranes with normal light reflex without erythema. Normal nasal mucosa.  Oropharynx is normal, no erythema or exudates, uvula midline.  Lymphatics: There is no palpable cervical or supraclavicular adenopathy.  Neck: Supple, full range of motion. Non-tender thyroid , no thyromegaly. No JVD. Cardiovascular: +S1S2, regular rate and rhythm, no murmurs, gallops or rubs appreciated. No carotid bruits. Respiratory: Normal effort, clear to  auscultation bilaterally. No wheezes, rales or rhonchi noted.  Extremities: No cyanosis, clubbing or edema. Pulses 2+ all 4  extremities. Neuro: Alert and oriented x 3. Normal gait.  Skin: Skin is warm and dry. No rash noted. Psychiatric: Behavior is Cooperative and Polite. Mood euthymyic. Affect is appropriate.   This document serves as a record of services personally performed by Lang Hurl, PA-C.  It was created on their behalf by Therisa Ronnald Reiter, CMA, a trained medical scribe, and Certified Medical Assistant (CMA). During the course of documenting the history, physical exam and medical decision making, I was functioning as a stage manager. The creation of this record is the provider's dictation and/or activities during the visit.  Electronically signed by Therisa Ronnald Reiter, CMA 11/23/2024 1:23 PM       [1] Patient Active Problem List Diagnosis  . Suspected sleep apnea  . Xanthoma  . Allergies  . Depression  . Fatigue  . Class 3 severe obesity with serious comorbidity and body mass index (BMI) of 40.0 to 44.9 in adult (CMD)  . Cigarette smoker  . Iron deficiency anemia  . Skin lesion  . Mild intermittent asthma without complication (CMD)  . Vitamin D insufficiency  . Acute stress disorder  . Breast pain, left  . Amenorrhea  . Right tubal pregnancy without intrauterine pregnancy (CMD)  . Epidermal inclusion cyst  . OSA (obstructive sleep apnea)  [2] Past Surgical History: Procedure Laterality Date  . CERVICAL BIOPSY  W/ LOOP ELECTRODE EXCISION  06/30/2019   CIN-1  . COLPOSCOPY  05/24/2018   HGSIL  . INDUCED ABORTION    . LAPAROSCOPIC TUBAL LIGATION Bilateral 08/30/2020   Karole Deno Blanch, MD  . MOUTH SURGERY      wisdom teeth removal  . SALPINGECTOMY Bilateral 11/03/2024   Bilateral SALPINGECTOMY LAPAROSCOPIC performed by Karole LOISE Blanch, MD at [facility] OR  [3] Family History Problem Relation Name Age of Onset  . Hypertension Mother Mischele Detter   . Heart disease Mother Raia Amico   . Psoriasis Father Lynwood Ellen   . Hypertension Father Lynwood Ellen   . Diabetes Father Lynwood Ellen   . Heart disease Father Lynwood Ellen   . Hypertension Maternal Grandmother Kenneth Pounds   . Diabetes Maternal Grandmother Kenneth Pounds   . Heart disease Maternal Grandmother Kenneth Pounds   . Breast cancer Half-Sister         late 35s  . Colon cancer Neg Hx    . Eczema Neg Hx    . Anesthesia problems Neg Hx    . Malig Hyperthermia Neg Hx    [4] Social History Socioeconomic History  . Marital status: Single  Tobacco Use  . Smoking status: Every Day    Current packs/day: 0.50    Types: Cigarettes, Cigars  . Smokeless tobacco: Never  Vaping Use  . Vaping status: Never Used  Substance and Sexual Activity  . Alcohol use: Yes  . Drug use: Not Currently    Types: Marijuana  . Sexual activity: Not Currently    Partners: Male    Birth control/protection: Surgical   Social Drivers of Health   Food Insecurity: Medium Risk (11/23/2024)   Food vital sign   . Within the past 12 months, you worried that your food would run out before you got money to buy more: Sometimes true   . Within the past 12 months, the food you bought just didn't last and you didn't have money to get more: Sometimes true  Safety: High Risk (04/05/2024)   Safety   . How often does anyone, including family and friends, physically hurt you?: Never   . How often does anyone, including family and friends, insult or talk down to you?: Fairly Often   . How often does anyone, including family and friends, threaten you with harm?: Rarely   . How often does anyone, including family and friends, scream or curse at you?: Rarely  Living Situation: Medium Risk (11/23/2024)   Living Situation   . What is your living situation today?: I have a place to live today, but I am worried about losing it in the future   . Think about the place you live. Do you have problems with any of the following? Choose all that apply:: Pt declined to answer  [5] Current Outpatient Medications  Medication Sig Dispense Refill  .  budesonide-formoteroL (Symbicort) 160-4.5 mcg/actuation inhaler Inhale 2 puffs in the morning and 2 puffs before bedtime. 1 each 3  . cetirizine -pseudoePHEDrine  (ZyrTEC ) 5-120 mg ER tablet Take 1 tablet by mouth daily.    . ferrous sulfate 325 mg (65 mg iron) tablet Take 325 mg by mouth daily.    . fluconazole (Diflucan) 150 mg tablet P.o. every 3 days 2 tablet 1  . medroxyPROGESTERone (Provera) 10 mg tablet 1 p.o. daily for 7 days now and repeat in 1 month days 20 through 27 14 tablet 3  . ondansetron  (ZOFRAN -ODT) 4 mg disintegrating tablet Dissolve 1 tablet (4 mg total) on tongue every 8 (eight) hours as needed for nausea. 30 tablet 0  . sertraline (ZOLOFT) 25 mg tablet Take 25 mg by mouth daily.    . triamcinolone  acetonide (KENALOG ) 0.1 % cream Apply topically 2 (two) times a day. 15 g 0  . Ventolin  HFA 90 mcg/actuation inhaler INHALE 2 PUFFS INTO THE LUNGS EVERY 6 HOURS AS NEEDED FOR SHORTNESS OF BREATH OR WHEEZING 18 g 0   No current facility-administered medications for this visit.

## 2024-11-26 ENCOUNTER — Encounter (HOSPITAL_BASED_OUTPATIENT_CLINIC_OR_DEPARTMENT_OTHER): Payer: Self-pay

## 2024-11-26 ENCOUNTER — Emergency Department (HOSPITAL_BASED_OUTPATIENT_CLINIC_OR_DEPARTMENT_OTHER)
Admission: EM | Admit: 2024-11-26 | Discharge: 2024-11-26 | Disposition: A | Discharge status: Home / Self Care | Attending: Emergency Medicine | Admitting: Emergency Medicine

## 2024-11-26 ENCOUNTER — Other Ambulatory Visit: Payer: Self-pay

## 2024-11-26 ENCOUNTER — Emergency Department (HOSPITAL_BASED_OUTPATIENT_CLINIC_OR_DEPARTMENT_OTHER)

## 2024-11-26 DIAGNOSIS — F17200 Nicotine dependence, unspecified, uncomplicated: Secondary | ICD-10-CM | POA: Insufficient documentation

## 2024-11-26 DIAGNOSIS — J45909 Unspecified asthma, uncomplicated: Secondary | ICD-10-CM | POA: Insufficient documentation

## 2024-11-26 DIAGNOSIS — J069 Acute upper respiratory infection, unspecified: Secondary | ICD-10-CM | POA: Insufficient documentation

## 2024-11-26 MED ORDER — HYDROCOD POLI-CHLORPHE POLI ER 10-8 MG/5ML PO SUER: 5.0000 mL | Freq: Every evening | ORAL | 0 refills | Status: AC | PRN

## 2024-11-26 MED ORDER — DEXAMETHASONE 4 MG PO TABS
12.0000 mg | ORAL_TABLET | Freq: Once | ORAL | Status: AC
  Administered 2024-11-26: 12 mg via ORAL
  Filled 2024-11-26: qty 3

## 2024-11-26 NOTE — ED Notes (Signed)
 Pt to and from RAD for CXR without incident, awaiting further dispo.

## 2024-11-26 NOTE — Discharge Instructions (Addendum)
 Please quit smoking to help your cough You can use your albuterol  inhaler 2 puffs every 4 hours for the next 48 hours The pills that were given to you in the ER should continue to help your cough and shortness of breath I have sent a prescription for cough medicine to your pharmacy.  This will make you drowsy, so you cannot drive or operate heavy machinery while taking it.   You can expect the cough to last to the next several days but should slowly improve You can take your ibuprofen  3 times a day for the next 3-5 days

## 2024-11-26 NOTE — ED Triage Notes (Signed)
 Pt to ED with c/o cough, sts non productive, but feels like there is something that needs to come up. Reports going to PCP on Thursday given tessalon  pearls w/o relief, sts s/s onset Monday for which she has tried motrin , nyquil, and tea with honey, no relief from same. Denies fevers, vomiting, abd pain/diarrhea. 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

## 2024-11-26 NOTE — ED Provider Notes (Signed)
 [place] [facility] Provider Note   CSN: [id number] Arrival date & time: 11/26/24  9547     Patient presents with: Cough   Michelle Giles is a 38 y.o. female.   The history is provided by the patient.  Patient history of asthma, depression presents with cough.  She has had these episodes for the past 6 days.  She reports intermittent cough and shortness of breath.  No fevers or vomiting.  No chest pain.  She does report sore throat. She is a current smoker.  She was recently seen by her PCP who gave her Tessalon  and albuterol  with minimal relief    Past Medical History:  Diagnosis Date   Asthma    Depression     Prior to Admission medications   Medication Sig Start Date End Date Taking? Authorizing Provider  chlorpheniramine-HYDROcodone  (TUSSIONEX) 10-8 MG/5ML Take 5 mLs by mouth at bedtime as needed for cough. 11/26/24  Yes Midge Golas, MD  albuterol  (VENTOLIN  HFA) 108 (90 Base) MCG/ACT inhaler Inhale 1-2 puffs into the lungs every 6 (six) hours as needed for wheezing or shortness of breath. 04/29/24   Jerral Meth, MD  ALBUTEROL  IN Inhale into the lungs.    [provider]  amitriptyline  (ELAVIL ) 10 MG tablet Take 1 tablet (10 mg total) by mouth at bedtime. 11/17/22   Camara, Amadou, MD  hydrOXYzine (VISTARIL) 25 MG capsule Take 25 mg by mouth 3 (three) times daily as needed for anxiety.    [provider]  lidocaine  (LIDODERM ) 5 % Place 1 patch onto the skin daily. Remove & Discard patch within 12 hours or as directed by MD 10/16/23   Silver Wonda LABOR, PA  omeprazole  (PRILOSEC) 20 MG capsule Take 1 capsule (20 mg total) by mouth daily. 11/24/16   Horton, Charmaine FALCON, MD  sertraline (ZOLOFT) 25 MG tablet Take 25 mg by mouth daily.    [provider]  triamcinolone  (NASACORT ) 55 MCG/ACT AERO nasal inhaler Place 2 sprays into the nose daily. 10/16/23   Silver Wonda LABOR, PA    Allergies: Penicillins     Review of Systems  Respiratory:  Positive for cough and shortness of breath.   Cardiovascular:  Negative for chest pain.    Updated Vital Signs BP 113/71   Pulse 93   Temp 98.5 F (36.9 C) (Oral)   Resp 18   Ht 1.575 m (5' 2)   Wt 102.1 kg   LMP 11/17/2024 (Approximate)   SpO2 96%   BMI 41.15 kg/m   Physical Exam CONSTITUTIONAL: Well developed/well nourished HEAD: Normocephalic/atraumatic EYES: EOMI/PERRL ENMT: Mucous membranes moist, uvula midline, no erythema or exudates, no stridor, no drooling NECK: supple no meningeal signs CV: S1/S2 noted, no murmurs/rubs/gallops noted LUNGS: Lungs are clear to auscultation bilaterally, no apparent distress, coughs infrequently during exam NEURO: Pt is awake/alert/appropriate, moves all extremitiesx4.  No facial droop.    (all labs ordered are listed, but only abnormal results are displayed) Labs Reviewed - No data to display  EKG: None  Radiology: DG Chest 2 View Result Date: 11/26/2024 EXAM: 2 VIEW(S) XRAY OF THE CHEST 11/26/2024 05:40:00 AM COMPARISON: 04/29/2024 CLINICAL HISTORY: cough FINDINGS: LUNGS AND PLEURA: No focal pulmonary opacity. No pleural effusion. No pneumothorax. HEART AND MEDIASTINUM: No acute abnormality of the cardiac and mediastinal silhouettes. BONES AND SOFT TISSUES: No acute osseous abnormality. IMPRESSION: 1. No acute process. Electronically signed by: Evalene Coho MD 11/26/2024 06:06 AM EST RP Workstation: [id number]  Procedures   Medications Ordered in the ED  dexamethasone  (DECADRON ) tablet 12 mg (12 mg Oral Given 11/26/24 0612)    Clinical Course as of 11/26/24 0618  Sun Nov 26, 2024  0617 Patient is overall well-appearing.  She is mostly concerned that her cough persists instructing her sleep.  She is use medications as prescribed with minimal relief.  She is also tried OTC meds without relief.  She reports she has headache and facial pain whenever she coughs. [DW]  9382 After long  discussion with patient, she will use her albuterol  inhaler 2 puffs every 4 hours for the next 2 days.  Given her history of asthma as well as a smoker, will give her one-time dose of Decadron  to help with her symptoms   Also prescribed her prescription cough medicine.  I advised that she cannot drive or operate heavy machinery while taking this medicine   [DW]  0618  at this time no indication for antibiotics as patient had negative x-ray, she is afebrile, history and symptoms and exam more consistent with viral process [DW]    Clinical Course User Index [DW] Midge Golas, MD                                 Medical Decision Making Amount and/or Complexity of Data Reviewed Radiology: ordered.  Risk Prescription drug management.   This patient presents to the ED for concern of cough, this involves an extensive number of treatment options, and is a complaint that carries with it a high risk of complications and morbidity.  The differential diagnosis includes but is not limited to viral URI, COVID-19, influenza, RSV, pneumonia, sinusitis, pulmonary embolism  Comorbidities that complicate the patient evaluation: Patient's presentation is complicated by their history of asthma  Social Determinants of Health: Patient's ongoing tobacco use  increases the complexity of managing their presentation  Additional history obtained: Records reviewed Care Everywhere/External Records and had recent negative viral panel   Imaging Studies ordered: I ordered imaging studies including X-ray chest  I independently visualized and interpreted imaging which showed no acute finding I agree with the radiologist interpretation  Test Considered: No indication for labs, as patient had negative viral panel earlier this week   Reevaluation: After the interventions noted above, I reevaluated the patient and found that they have :stayed the same  Complexity of problems addressed: Patient's  presentation is most consistent with  acute complicated illness/injury requiring diagnostic workup  Disposition: After consideration of the diagnostic results and the patient's response to treatment,  I feel that the patent would benefit from discharge  .        Final diagnoses:  Viral URI with cough    ED Discharge Orders          Ordered    chlorpheniramine-HYDROcodone  (TUSSIONEX) 10-8 MG/5ML  At bedtime PRN        11/26/24 9385               Midge Golas, MD 11/26/24 [phone]
